# Patient Record
Sex: Male | Born: 1990 | Race: White | Hispanic: No | Marital: Single | Smoking: Current every day smoker
Health system: Southern US, Community
[De-identification: ages and names within clinical notes are randomized; demographics above are authoritative.]

## PROBLEM LIST (undated history)

## (undated) ENCOUNTER — Emergency Department (HOSPITAL_COMMUNITY): Discharge status: Against Medical Advice | Payer: Self-pay

## (undated) DIAGNOSIS — M549 Dorsalgia, unspecified: Secondary | ICD-10-CM

## (undated) DIAGNOSIS — R002 Palpitations: Secondary | ICD-10-CM

## (undated) DIAGNOSIS — R569 Unspecified convulsions: Secondary | ICD-10-CM

## (undated) DIAGNOSIS — G8929 Other chronic pain: Secondary | ICD-10-CM

## (undated) HISTORY — PX: FRACTURE SURGERY: SHX138

## (undated) HISTORY — PX: WRIST FRACTURE SURGERY: SHX121

---
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
## 2008-05-08 ENCOUNTER — Emergency Department (HOSPITAL_COMMUNITY): Admission: EM | Admit: 2008-05-08 | Discharge: 2008-05-08 | Payer: Self-pay | Admitting: Emergency Medicine

## 2008-09-30 ENCOUNTER — Emergency Department (HOSPITAL_COMMUNITY): Admission: EM | Admit: 2008-09-30 | Discharge: 2008-09-30 | Payer: Self-pay | Admitting: Emergency Medicine

## 2009-02-17 ENCOUNTER — Emergency Department (HOSPITAL_COMMUNITY): Admission: EM | Admit: 2009-02-17 | Discharge: 2009-02-17 | Payer: Self-pay | Admitting: Emergency Medicine

## 2009-05-15 ENCOUNTER — Emergency Department (HOSPITAL_COMMUNITY): Admission: EM | Admit: 2009-05-15 | Discharge: 2009-05-16 | Payer: Self-pay | Admitting: Emergency Medicine

## 2009-05-17 ENCOUNTER — Emergency Department (HOSPITAL_COMMUNITY): Admission: EM | Admit: 2009-05-17 | Discharge: 2009-05-17 | Payer: Self-pay | Admitting: Emergency Medicine

## 2009-08-07 ENCOUNTER — Emergency Department (HOSPITAL_COMMUNITY): Admission: EM | Admit: 2009-08-07 | Discharge: 2009-08-07 | Payer: Self-pay | Admitting: Family Medicine

## 2009-09-21 ENCOUNTER — Emergency Department (HOSPITAL_COMMUNITY): Admission: EM | Admit: 2009-09-21 | Discharge: 2009-09-22 | Payer: Self-pay | Admitting: Emergency Medicine

## 2009-10-23 ENCOUNTER — Emergency Department (HOSPITAL_COMMUNITY): Admission: EM | Admit: 2009-10-23 | Discharge: 2009-10-23 | Payer: Self-pay | Admitting: Family Medicine

## 2009-11-01 ENCOUNTER — Observation Stay (HOSPITAL_COMMUNITY): Admission: EM | Admit: 2009-11-01 | Discharge: 2009-11-02 | Payer: Self-pay | Admitting: Emergency Medicine

## 2009-12-14 ENCOUNTER — Emergency Department (HOSPITAL_COMMUNITY): Admission: EM | Admit: 2009-12-14 | Discharge: 2009-12-14 | Payer: Self-pay | Admitting: Emergency Medicine

## 2009-12-31 ENCOUNTER — Emergency Department (HOSPITAL_COMMUNITY): Admission: EM | Admit: 2009-12-31 | Discharge: 2009-12-31 | Payer: Self-pay | Admitting: Emergency Medicine

## 2010-01-12 ENCOUNTER — Emergency Department (HOSPITAL_COMMUNITY): Admission: EM | Admit: 2010-01-12 | Discharge: 2010-01-12 | Payer: Self-pay | Admitting: Emergency Medicine

## 2010-01-15 ENCOUNTER — Emergency Department (HOSPITAL_COMMUNITY): Admission: EM | Admit: 2010-01-15 | Discharge: 2010-01-15 | Payer: Self-pay | Admitting: Family Medicine

## 2010-02-17 ENCOUNTER — Emergency Department (HOSPITAL_COMMUNITY): Admission: EM | Admit: 2010-02-17 | Discharge: 2010-02-17 | Payer: Self-pay | Admitting: Emergency Medicine

## 2010-04-15 ENCOUNTER — Emergency Department (HOSPITAL_COMMUNITY): Admission: EM | Admit: 2010-04-15 | Discharge: 2010-04-15 | Payer: Self-pay | Admitting: Emergency Medicine

## 2010-05-03 ENCOUNTER — Emergency Department (HOSPITAL_COMMUNITY): Admission: EM | Admit: 2010-05-03 | Discharge: 2010-05-03 | Payer: Self-pay | Admitting: Emergency Medicine

## 2010-05-25 ENCOUNTER — Emergency Department (HOSPITAL_COMMUNITY): Admission: EM | Admit: 2010-05-25 | Discharge: 2010-05-25 | Payer: Self-pay | Admitting: Emergency Medicine

## 2010-06-20 ENCOUNTER — Emergency Department (HOSPITAL_COMMUNITY): Admission: EM | Admit: 2010-06-20 | Discharge: 2010-06-21 | Payer: Self-pay | Admitting: Emergency Medicine

## 2010-08-07 ENCOUNTER — Emergency Department (HOSPITAL_COMMUNITY): Admission: EM | Admit: 2010-08-07 | Discharge: 2010-08-07 | Payer: Self-pay | Admitting: Emergency Medicine

## 2010-09-14 ENCOUNTER — Emergency Department (HOSPITAL_COMMUNITY): Admission: EM | Admit: 2010-09-14 | Discharge: 2010-09-14 | Payer: Self-pay | Admitting: Emergency Medicine

## 2010-09-26 ENCOUNTER — Emergency Department (HOSPITAL_COMMUNITY): Admission: EM | Admit: 2010-09-26 | Discharge: 2010-09-26 | Payer: Self-pay | Admitting: Emergency Medicine

## 2010-10-05 ENCOUNTER — Emergency Department (HOSPITAL_COMMUNITY): Admission: EM | Admit: 2010-10-05 | Discharge: 2010-10-06 | Payer: Self-pay | Admitting: Emergency Medicine

## 2010-12-08 ENCOUNTER — Emergency Department (HOSPITAL_COMMUNITY)
Admission: EM | Admit: 2010-12-08 | Discharge: 2010-12-08 | Payer: Self-pay | Source: Home / Self Care | Admitting: Emergency Medicine

## 2011-01-02 ENCOUNTER — Emergency Department (HOSPITAL_COMMUNITY)
Admission: EM | Admit: 2011-01-02 | Discharge: 2011-01-02 | Payer: Self-pay | Source: Home / Self Care | Admitting: Emergency Medicine

## 2011-02-22 LAB — POCT I-STAT, CHEM 8
Calcium, Ion: 1.08 mmol/L — ABNORMAL LOW (ref 1.12–1.32)
Glucose, Bld: 95 mg/dL (ref 70–99)
HCT: 50 % (ref 39.0–52.0)
Hemoglobin: 17 g/dL (ref 13.0–17.0)

## 2011-02-22 LAB — DIFFERENTIAL
Basophils Relative: 1 % (ref 0–1)
Eosinophils Absolute: 0.2 10*3/uL (ref 0.0–0.7)
Eosinophils Relative: 2 % (ref 0–5)
Monocytes Absolute: 0.7 10*3/uL (ref 0.1–1.0)
Monocytes Relative: 7 % (ref 3–12)

## 2011-02-22 LAB — CBC
HCT: 47.5 % (ref 39.0–52.0)
Hemoglobin: 16.7 g/dL (ref 13.0–17.0)
MCH: 34.2 pg — ABNORMAL HIGH (ref 26.0–34.0)
MCHC: 35.2 g/dL (ref 30.0–36.0)

## 2011-02-22 LAB — URINALYSIS, ROUTINE W REFLEX MICROSCOPIC
Ketones, ur: 15 mg/dL — AB
Nitrite: NEGATIVE
Urobilinogen, UA: 1 mg/dL (ref 0.0–1.0)
pH: 6 (ref 5.0–8.0)

## 2011-02-28 LAB — URINALYSIS, ROUTINE W REFLEX MICROSCOPIC
Bilirubin Urine: NEGATIVE
Ketones, ur: NEGATIVE mg/dL
Nitrite: NEGATIVE
Protein, ur: NEGATIVE mg/dL
Specific Gravity, Urine: 1.027 (ref 1.005–1.030)
Urobilinogen, UA: 1 mg/dL (ref 0.0–1.0)

## 2011-02-28 LAB — DIFFERENTIAL
Basophils Absolute: 0 10*3/uL (ref 0.0–0.1)
Basophils Relative: 1 % (ref 0–1)
Eosinophils Absolute: 0.2 10*3/uL (ref 0.0–0.7)
Monocytes Absolute: 0.6 10*3/uL (ref 0.1–1.0)
Neutro Abs: 4.9 10*3/uL (ref 1.7–7.7)

## 2011-02-28 LAB — POCT I-STAT, CHEM 8
BUN: 11 mg/dL (ref 6–23)
Calcium, Ion: 1.16 mmol/L (ref 1.12–1.32)
Chloride: 104 mEq/L (ref 96–112)
Creatinine, Ser: 0.8 mg/dL (ref 0.4–1.5)
Glucose, Bld: 104 mg/dL — ABNORMAL HIGH (ref 70–99)
TCO2: 29 mmol/L (ref 0–100)

## 2011-02-28 LAB — CBC
Hemoglobin: 14.9 g/dL (ref 13.0–17.0)
MCHC: 35.5 g/dL (ref 30.0–36.0)
RDW: 12.8 % (ref 11.5–15.5)

## 2011-02-28 LAB — POCT CARDIAC MARKERS: Troponin i, poc: <0.05 ng/mL (ref 0.00–0.09)

## 2011-03-02 LAB — DIFFERENTIAL
Eosinophils Relative: 4 % (ref 0–5)
Lymphocytes Relative: 27 % (ref 12–46)
Lymphs Abs: 2.6 10*3/uL (ref 0.7–4.0)
Monocytes Relative: 6 % (ref 3–12)

## 2011-03-02 LAB — CBC
HCT: 44.6 % (ref 39.0–52.0)
Hemoglobin: 15.8 g/dL (ref 13.0–17.0)
MCV: 94.3 fL (ref 78.0–100.0)
Platelets: 243 10*3/uL (ref 150–400)
RBC: 4.73 MIL/uL (ref 4.22–5.81)
WBC: 9.7 10*3/uL (ref 4.0–10.5)

## 2011-03-02 LAB — POCT CARDIAC MARKERS
CKMB, poc: <1 ng/mL — ABNORMAL LOW (ref 1.0–8.0)
Myoglobin, poc: 45.8 ng/mL (ref 12–200)

## 2011-03-02 LAB — POCT I-STAT, CHEM 8
BUN: 12 mg/dL (ref 6–23)
Creatinine, Ser: 0.9 mg/dL (ref 0.4–1.5)
Glucose, Bld: 94 mg/dL (ref 70–99)
Sodium: 140 mEq/L (ref 135–145)
TCO2: 28 mmol/L (ref 0–100)

## 2011-03-03 LAB — POCT RAPID STREP A (OFFICE): Streptococcus, Group A Screen (Direct): POSITIVE — AB

## 2011-03-17 LAB — CBC
Platelets: 257 10*3/uL (ref 150–400)
RBC: 4.83 MIL/uL (ref 4.22–5.81)
WBC: 10.8 10*3/uL — ABNORMAL HIGH (ref 4.0–10.5)
WBC: 9.1 10*3/uL (ref 4.0–10.5)

## 2011-03-17 LAB — POCT URINALYSIS DIP (DEVICE)
Bilirubin Urine: NEGATIVE
Hgb urine dipstick: NEGATIVE
Ketones, ur: NEGATIVE mg/dL
pH: 7 (ref 5.0–8.0)

## 2011-03-17 LAB — POCT I-STAT, CHEM 8
BUN: 13 mg/dL (ref 6–23)
Calcium, Ion: 1.25 mmol/L (ref 1.12–1.32)
Chloride: 102 mEq/L (ref 96–112)

## 2011-03-17 LAB — DIFFERENTIAL
Basophils Relative: 1 % (ref 0–1)
Lymphocytes Relative: 20 % (ref 12–46)
Lymphs Abs: 2.2 10*3/uL (ref 0.7–4.0)
Lymphs Abs: 2.4 10*3/uL (ref 0.7–4.0)
Monocytes Relative: 9 % (ref 3–12)
Neutro Abs: 5.6 10*3/uL (ref 1.7–7.7)
Neutrophils Relative %: 73 % (ref 43–77)
Neutrophils Relative %: 62 % (ref 43–77)

## 2011-03-17 LAB — BASIC METABOLIC PANEL
BUN: 8 mg/dL (ref 6–23)
Creatinine, Ser: 0.89 mg/dL (ref 0.4–1.5)
GFR calc Af Amer: >60 mL/min (ref >60)
GFR calc non Af Amer: >60 mL/min (ref >60)
Potassium: 3.5 mEq/L (ref 3.5–5.1)

## 2011-03-17 LAB — PROTIME-INR
INR: 1.04 (ref 0.00–1.49)
Prothrombin Time: 13.5 seconds (ref 11.6–15.2)

## 2011-03-17 LAB — APTT: aPTT: 29 seconds (ref 24–37)

## 2011-03-17 LAB — ETHANOL: Alcohol, Ethyl (B): 63 mg/dL — ABNORMAL HIGH (ref 0–10)

## 2011-03-18 LAB — POCT I-STAT, CHEM 8
BUN: 16 mg/dL (ref 6–23)
Chloride: 102 mEq/L (ref 96–112)
Creatinine, Ser: 0.8 mg/dL (ref 0.4–1.5)
Glucose, Bld: 106 mg/dL — ABNORMAL HIGH (ref 70–99)
HCT: 47 % (ref 39.0–52.0)
Potassium: 4 mEq/L (ref 3.5–5.1)

## 2011-03-18 LAB — CBC
HCT: 43.7 % (ref 39.0–52.0)
Hemoglobin: 15.2 g/dL (ref 13.0–17.0)
MCV: 95.4 fL (ref 78.0–100.0)
Platelets: 277 10*3/uL (ref 150–400)
RBC: 4.58 MIL/uL (ref 4.22–5.81)
WBC: 8.7 10*3/uL (ref 4.0–10.5)

## 2011-03-18 LAB — DIFFERENTIAL
Eosinophils Absolute: 0.3 10*3/uL (ref 0.0–0.7)
Eosinophils Relative: 4 % (ref 0–5)
Lymphs Abs: 2.4 10*3/uL (ref 0.7–4.0)
Monocytes Absolute: 0.6 10*3/uL (ref 0.1–1.0)
Monocytes Relative: 6 % (ref 3–12)

## 2011-03-18 LAB — URINALYSIS, ROUTINE W REFLEX MICROSCOPIC
Nitrite: NEGATIVE
Protein, ur: NEGATIVE mg/dL
Specific Gravity, Urine: 1.005 (ref 1.005–1.030)
Urobilinogen, UA: 0.2 mg/dL (ref 0.0–1.0)

## 2011-03-18 LAB — RAPID URINE DRUG SCREEN, HOSP PERFORMED
Amphetamines: NOT DETECTED
Barbiturates: NOT DETECTED

## 2011-03-20 ENCOUNTER — Emergency Department (HOSPITAL_COMMUNITY)
Admission: EM | Admit: 2011-03-20 | Discharge: 2011-03-20 | Disposition: A | Discharge status: Home / Self Care | Payer: Medicare Other | Attending: Emergency Medicine | Admitting: Emergency Medicine

## 2011-03-20 ENCOUNTER — Emergency Department (HOSPITAL_COMMUNITY): Payer: Medicare Other

## 2011-03-20 DIAGNOSIS — M25569 Pain in unspecified knee: Secondary | ICD-10-CM | POA: Insufficient documentation

## 2011-03-20 DIAGNOSIS — IMO0002 Reserved for concepts with insufficient information to code with codable children: Secondary | ICD-10-CM | POA: Insufficient documentation

## 2011-03-20 DIAGNOSIS — K219 Gastro-esophageal reflux disease without esophagitis: Secondary | ICD-10-CM | POA: Insufficient documentation

## 2011-03-20 DIAGNOSIS — Y9361 Activity, american tackle football: Secondary | ICD-10-CM | POA: Insufficient documentation

## 2011-03-20 DIAGNOSIS — M7989 Other specified soft tissue disorders: Secondary | ICD-10-CM | POA: Insufficient documentation

## 2011-03-20 DIAGNOSIS — W219XXA Striking against or struck by unspecified sports equipment, initial encounter: Secondary | ICD-10-CM | POA: Insufficient documentation

## 2011-03-20 DIAGNOSIS — Y929 Unspecified place or not applicable: Secondary | ICD-10-CM | POA: Insufficient documentation

## 2011-04-02 ENCOUNTER — Emergency Department (HOSPITAL_COMMUNITY)
Admission: EM | Admit: 2011-04-02 | Discharge: 2011-04-02 | Disposition: A | Discharge status: Home / Self Care | Payer: Medicare Other | Attending: Emergency Medicine | Admitting: Emergency Medicine

## 2011-04-02 DIAGNOSIS — K219 Gastro-esophageal reflux disease without esophagitis: Secondary | ICD-10-CM | POA: Insufficient documentation

## 2011-04-02 DIAGNOSIS — K089 Disorder of teeth and supporting structures, unspecified: Secondary | ICD-10-CM | POA: Insufficient documentation

## 2011-08-02 ENCOUNTER — Emergency Department (HOSPITAL_COMMUNITY): Payer: Medicare Other

## 2011-08-02 ENCOUNTER — Emergency Department (HOSPITAL_COMMUNITY)
Admission: EM | Admit: 2011-08-02 | Discharge: 2011-08-02 | Disposition: A | Discharge status: Home / Self Care | Payer: Medicare Other | Attending: Emergency Medicine | Admitting: Emergency Medicine

## 2011-08-02 DIAGNOSIS — S4980XA Other specified injuries of shoulder and upper arm, unspecified arm, initial encounter: Secondary | ICD-10-CM | POA: Insufficient documentation

## 2011-08-02 DIAGNOSIS — Y92009 Unspecified place in unspecified non-institutional (private) residence as the place of occurrence of the external cause: Secondary | ICD-10-CM | POA: Insufficient documentation

## 2011-08-02 DIAGNOSIS — S46909A Unspecified injury of unspecified muscle, fascia and tendon at shoulder and upper arm level, unspecified arm, initial encounter: Secondary | ICD-10-CM | POA: Insufficient documentation

## 2011-08-02 DIAGNOSIS — K219 Gastro-esophageal reflux disease without esophagitis: Secondary | ICD-10-CM | POA: Insufficient documentation

## 2011-08-02 DIAGNOSIS — W2209XA Striking against other stationary object, initial encounter: Secondary | ICD-10-CM | POA: Insufficient documentation

## 2011-08-02 DIAGNOSIS — M79609 Pain in unspecified limb: Secondary | ICD-10-CM | POA: Insufficient documentation

## 2011-08-02 DIAGNOSIS — S40029A Contusion of unspecified upper arm, initial encounter: Secondary | ICD-10-CM | POA: Insufficient documentation

## 2011-08-13 ENCOUNTER — Emergency Department (HOSPITAL_COMMUNITY): Payer: Medicare Other

## 2011-08-13 ENCOUNTER — Emergency Department (HOSPITAL_COMMUNITY)
Admission: EM | Admit: 2011-08-13 | Discharge: 2011-08-13 | Disposition: A | Discharge status: Home / Self Care | Payer: Medicare Other | Attending: Emergency Medicine | Admitting: Emergency Medicine

## 2011-08-13 DIAGNOSIS — H5710 Ocular pain, unspecified eye: Secondary | ICD-10-CM | POA: Insufficient documentation

## 2011-08-13 DIAGNOSIS — H5789 Other specified disorders of eye and adnexa: Secondary | ICD-10-CM | POA: Insufficient documentation

## 2011-08-13 DIAGNOSIS — M542 Cervicalgia: Secondary | ICD-10-CM | POA: Insufficient documentation

## 2011-08-13 DIAGNOSIS — S0180XA Unspecified open wound of other part of head, initial encounter: Secondary | ICD-10-CM | POA: Insufficient documentation

## 2011-08-13 DIAGNOSIS — R471 Dysarthria and anarthria: Secondary | ICD-10-CM | POA: Insufficient documentation

## 2011-08-13 DIAGNOSIS — S0280XA Fracture of other specified skull and facial bones, unspecified side, initial encounter for closed fracture: Secondary | ICD-10-CM | POA: Insufficient documentation

## 2011-08-13 DIAGNOSIS — K219 Gastro-esophageal reflux disease without esophagitis: Secondary | ICD-10-CM | POA: Insufficient documentation

## 2011-08-13 DIAGNOSIS — R279 Unspecified lack of coordination: Secondary | ICD-10-CM | POA: Insufficient documentation

## 2011-08-13 DIAGNOSIS — F1010 Alcohol abuse, uncomplicated: Secondary | ICD-10-CM | POA: Insufficient documentation

## 2011-08-13 DIAGNOSIS — R51 Headache: Secondary | ICD-10-CM | POA: Insufficient documentation

## 2011-08-13 DIAGNOSIS — S0510XA Contusion of eyeball and orbital tissues, unspecified eye, initial encounter: Secondary | ICD-10-CM | POA: Insufficient documentation

## 2011-08-13 LAB — POCT I-STAT, CHEM 8
BUN: 12 mg/dL (ref 6–23)
Chloride: 99 mEq/L (ref 96–112)
Sodium: 140 mEq/L (ref 135–145)

## 2011-08-13 LAB — CBC
Hemoglobin: 15.5 g/dL (ref 13.0–17.0)
MCH: 33.7 pg (ref 26.0–34.0)
MCV: 94.6 fL (ref 78.0–100.0)
RBC: 4.6 MIL/uL (ref 4.22–5.81)

## 2011-08-13 LAB — DIFFERENTIAL
Lymphs Abs: 3.2 10*3/uL (ref 0.7–4.0)
Monocytes Relative: 5 % (ref 3–12)
Neutro Abs: 9.2 10*3/uL — ABNORMAL HIGH (ref 1.7–7.7)
Neutrophils Relative %: 68 % (ref 43–77)

## 2011-08-13 LAB — ETHANOL: Alcohol, Ethyl (B): 156 mg/dL — ABNORMAL HIGH (ref 0–11)

## 2011-08-16 ENCOUNTER — Emergency Department (HOSPITAL_COMMUNITY)
Admission: EM | Admit: 2011-08-16 | Discharge: 2011-08-16 | Disposition: A | Discharge status: Home / Self Care | Payer: Medicare Other | Attending: Emergency Medicine | Admitting: Emergency Medicine

## 2011-08-16 ENCOUNTER — Emergency Department (HOSPITAL_COMMUNITY): Payer: Medicare Other

## 2011-08-16 DIAGNOSIS — S0280XA Fracture of other specified skull and facial bones, unspecified side, initial encounter for closed fracture: Secondary | ICD-10-CM | POA: Insufficient documentation

## 2011-08-16 DIAGNOSIS — H5710 Ocular pain, unspecified eye: Secondary | ICD-10-CM | POA: Insufficient documentation

## 2011-08-16 DIAGNOSIS — H5789 Other specified disorders of eye and adnexa: Secondary | ICD-10-CM | POA: Insufficient documentation

## 2011-08-16 DIAGNOSIS — S0510XA Contusion of eyeball and orbital tissues, unspecified eye, initial encounter: Secondary | ICD-10-CM | POA: Insufficient documentation

## 2011-08-16 DIAGNOSIS — K219 Gastro-esophageal reflux disease without esophagitis: Secondary | ICD-10-CM | POA: Insufficient documentation

## 2011-08-16 DIAGNOSIS — H538 Other visual disturbances: Secondary | ICD-10-CM | POA: Insufficient documentation

## 2011-08-16 DIAGNOSIS — R51 Headache: Secondary | ICD-10-CM | POA: Insufficient documentation

## 2011-08-16 DIAGNOSIS — R42 Dizziness and giddiness: Secondary | ICD-10-CM | POA: Insufficient documentation

## 2011-09-05 ENCOUNTER — Emergency Department (HOSPITAL_COMMUNITY)
Admission: EM | Admit: 2011-09-05 | Discharge: 2011-09-05 | Disposition: A | Discharge status: Home / Self Care | Payer: Medicare Other | Attending: Emergency Medicine | Admitting: Emergency Medicine

## 2011-09-05 DIAGNOSIS — K219 Gastro-esophageal reflux disease without esophagitis: Secondary | ICD-10-CM | POA: Insufficient documentation

## 2011-09-05 DIAGNOSIS — R059 Cough, unspecified: Secondary | ICD-10-CM | POA: Insufficient documentation

## 2011-09-05 DIAGNOSIS — R05 Cough: Secondary | ICD-10-CM | POA: Insufficient documentation

## 2011-09-05 DIAGNOSIS — J3489 Other specified disorders of nose and nasal sinuses: Secondary | ICD-10-CM | POA: Insufficient documentation

## 2011-09-05 DIAGNOSIS — R112 Nausea with vomiting, unspecified: Secondary | ICD-10-CM | POA: Insufficient documentation

## 2011-09-05 DIAGNOSIS — R071 Chest pain on breathing: Secondary | ICD-10-CM | POA: Insufficient documentation

## 2011-09-05 DIAGNOSIS — R070 Pain in throat: Secondary | ICD-10-CM | POA: Insufficient documentation

## 2011-09-20 ENCOUNTER — Emergency Department (HOSPITAL_COMMUNITY)
Admission: EM | Admit: 2011-09-20 | Discharge: 2011-09-20 | Disposition: A | Discharge status: Home / Self Care | Payer: Medicare Other | Attending: Emergency Medicine | Admitting: Emergency Medicine

## 2011-09-20 DIAGNOSIS — R059 Cough, unspecified: Secondary | ICD-10-CM | POA: Insufficient documentation

## 2011-09-20 DIAGNOSIS — R1110 Vomiting, unspecified: Secondary | ICD-10-CM | POA: Insufficient documentation

## 2011-09-20 DIAGNOSIS — J40 Bronchitis, not specified as acute or chronic: Secondary | ICD-10-CM | POA: Insufficient documentation

## 2011-09-20 DIAGNOSIS — J069 Acute upper respiratory infection, unspecified: Secondary | ICD-10-CM | POA: Insufficient documentation

## 2011-09-20 DIAGNOSIS — R0602 Shortness of breath: Secondary | ICD-10-CM | POA: Insufficient documentation

## 2011-09-20 DIAGNOSIS — K219 Gastro-esophageal reflux disease without esophagitis: Secondary | ICD-10-CM | POA: Insufficient documentation

## 2011-09-20 DIAGNOSIS — R05 Cough: Secondary | ICD-10-CM | POA: Insufficient documentation

## 2011-09-20 DIAGNOSIS — R509 Fever, unspecified: Secondary | ICD-10-CM | POA: Insufficient documentation

## 2011-09-20 DIAGNOSIS — J029 Acute pharyngitis, unspecified: Secondary | ICD-10-CM | POA: Insufficient documentation

## 2011-09-20 LAB — RAPID STREP SCREEN (MED CTR MEBANE ONLY): Streptococcus, Group A Screen (Direct): NEGATIVE

## 2011-09-25 ENCOUNTER — Emergency Department (HOSPITAL_COMMUNITY)
Admission: EM | Admit: 2011-09-25 | Discharge: 2011-09-25 | Disposition: A | Discharge status: Home / Self Care | Payer: Medicare Other | Attending: Emergency Medicine | Admitting: Emergency Medicine

## 2011-09-25 DIAGNOSIS — R1310 Dysphagia, unspecified: Secondary | ICD-10-CM | POA: Insufficient documentation

## 2011-09-25 DIAGNOSIS — K219 Gastro-esophageal reflux disease without esophagitis: Secondary | ICD-10-CM | POA: Insufficient documentation

## 2011-09-25 DIAGNOSIS — R070 Pain in throat: Secondary | ICD-10-CM | POA: Insufficient documentation

## 2011-09-25 LAB — RAPID STREP SCREEN (MED CTR MEBANE ONLY): Streptococcus, Group A Screen (Direct): NEGATIVE

## 2011-10-10 ENCOUNTER — Emergency Department (HOSPITAL_COMMUNITY): Payer: Medicare Other

## 2011-10-10 ENCOUNTER — Emergency Department (HOSPITAL_COMMUNITY)
Admission: EM | Admit: 2011-10-10 | Discharge: 2011-10-10 | Disposition: A | Discharge status: Home / Self Care | Payer: Medicare Other | Attending: Emergency Medicine | Admitting: Emergency Medicine

## 2011-10-10 DIAGNOSIS — S6730XA Crushing injury of unspecified wrist, initial encounter: Secondary | ICD-10-CM | POA: Insufficient documentation

## 2011-10-10 DIAGNOSIS — K219 Gastro-esophageal reflux disease without esophagitis: Secondary | ICD-10-CM | POA: Insufficient documentation

## 2011-10-10 DIAGNOSIS — W230XXA Caught, crushed, jammed, or pinched between moving objects, initial encounter: Secondary | ICD-10-CM | POA: Insufficient documentation

## 2011-10-10 DIAGNOSIS — M79609 Pain in unspecified limb: Secondary | ICD-10-CM | POA: Insufficient documentation

## 2011-10-10 DIAGNOSIS — M25539 Pain in unspecified wrist: Secondary | ICD-10-CM | POA: Insufficient documentation

## 2011-10-15 ENCOUNTER — Emergency Department (HOSPITAL_COMMUNITY)
Admission: EM | Admit: 2011-10-15 | Discharge: 2011-10-15 | Disposition: A | Discharge status: Home / Self Care | Payer: Medicare Other | Attending: Emergency Medicine | Admitting: Emergency Medicine

## 2011-10-15 DIAGNOSIS — K219 Gastro-esophageal reflux disease without esophagitis: Secondary | ICD-10-CM | POA: Insufficient documentation

## 2011-10-15 DIAGNOSIS — M25539 Pain in unspecified wrist: Secondary | ICD-10-CM | POA: Insufficient documentation

## 2011-10-31 ENCOUNTER — Emergency Department (HOSPITAL_COMMUNITY): Payer: Medicare Other

## 2011-10-31 ENCOUNTER — Emergency Department (HOSPITAL_COMMUNITY)
Admission: EM | Admit: 2011-10-31 | Discharge: 2011-10-31 | Disposition: A | Discharge status: Home / Self Care | Payer: Medicare Other | Attending: Emergency Medicine | Admitting: Emergency Medicine

## 2011-10-31 DIAGNOSIS — R51 Headache: Secondary | ICD-10-CM | POA: Insufficient documentation

## 2011-10-31 DIAGNOSIS — M25569 Pain in unspecified knee: Secondary | ICD-10-CM | POA: Insufficient documentation

## 2011-10-31 DIAGNOSIS — M79609 Pain in unspecified limb: Secondary | ICD-10-CM | POA: Insufficient documentation

## 2011-10-31 DIAGNOSIS — M25561 Pain in right knee: Secondary | ICD-10-CM

## 2011-10-31 DIAGNOSIS — M79601 Pain in right arm: Secondary | ICD-10-CM

## 2011-10-31 DIAGNOSIS — IMO0002 Reserved for concepts with insufficient information to code with codable children: Secondary | ICD-10-CM | POA: Insufficient documentation

## 2011-10-31 MED ORDER — OXYCODONE-ACETAMINOPHEN 5-325 MG PO TABS
2.0000 | ORAL_TABLET | Freq: Once | ORAL | Status: AC
  Administered 2011-10-31: 2 via ORAL
  Filled 2011-10-31 (×2): qty 1

## 2011-10-31 MED ORDER — HYDROCODONE-ACETAMINOPHEN 5-325 MG PO TABS: 2.0000 | ORAL_TABLET | ORAL | Status: AC | PRN

## 2011-10-31 NOTE — ED Provider Notes (Signed)
Medical screening examination/treatment/procedure(s) were performed by non-physician practitioner and as supervising physician I was immediately available for consultation/collaboration.  Zaeem Kandel L Kady Toothaker, MD 10/31/11 2116 
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
## 2011-11-19 ENCOUNTER — Encounter (HOSPITAL_COMMUNITY): Payer: Self-pay | Admitting: *Deleted

## 2011-11-19 ENCOUNTER — Emergency Department (HOSPITAL_COMMUNITY): Payer: Medicare Other

## 2011-11-19 ENCOUNTER — Emergency Department (HOSPITAL_COMMUNITY)
Admission: EM | Admit: 2011-11-19 | Discharge: 2011-11-19 | Disposition: A | Discharge status: Home / Self Care | Payer: Medicare Other | Attending: Emergency Medicine | Admitting: Emergency Medicine

## 2011-11-19 DIAGNOSIS — M546 Pain in thoracic spine: Secondary | ICD-10-CM | POA: Insufficient documentation

## 2011-11-19 DIAGNOSIS — M549 Dorsalgia, unspecified: Secondary | ICD-10-CM

## 2011-11-19 MED ORDER — OXYCODONE-ACETAMINOPHEN 5-325 MG PO TABS: 2.0000 | ORAL_TABLET | ORAL | Status: AC | PRN

## 2011-11-19 MED ORDER — CYCLOBENZAPRINE HCL 10 MG PO TABS: 10.0000 mg | ORAL_TABLET | Freq: Two times a day (BID) | ORAL | Status: AC | PRN

## 2011-11-19 MED ORDER — OXYCODONE-ACETAMINOPHEN 5-325 MG PO TABS
1.0000 | ORAL_TABLET | Freq: Once | ORAL | Status: AC
  Administered 2011-11-19: 1 via ORAL
  Filled 2011-11-19: qty 1

## 2011-11-19 MED ORDER — DIAZEPAM 5 MG PO TABS
5.0000 mg | ORAL_TABLET | Freq: Once | ORAL | Status: AC
Start: 2011-11-19 — End: 2011-11-19
  Administered 2011-11-19: 5 mg via ORAL
  Filled 2011-11-19: qty 1

## 2011-11-19 NOTE — ED Notes (Signed)
Patient was playing football last night.  He was tackled.  He states he is now having mid back pain.  Patient reports he has diff walking due to increased pain

## 2011-11-19 NOTE — ED Notes (Signed)
Playing football with brother and threw back out after being tackled by brother. This occurred last night. Rates pain as 10/10. Sharp stabbing pain with movement.

## 2011-11-19 NOTE — ED Provider Notes (Signed)
History     CSN: [id number] Arrival date & time: 11/19/2011 11:09 AM   First MD Initiated Contact with Patient 11/19/11 1119      Chief Complaint  Patient presents with  . Back Pain    (Consider location/radiation/quality/duration/timing/severity/associated sxs/prior treatment) Patient is a 20 y.o. male presenting with back pain. The history is provided by the patient.  Back Pain  This is a new problem. The current episode started yesterday. The problem occurs constantly. The problem has been gradually worsening. The pain is present in the thoracic spine. The quality of the pain is described as stabbing. The pain does not radiate. The pain is severe. The symptoms are aggravated by bending, twisting and certain positions. The pain is the same all the time. Pertinent negatives include no bowel incontinence and no bladder incontinence. He has tried NSAIDs for the symptoms. The treatment provided no relief.   Patient states he was playing football with brother yesterday, received hard tackle.  Discomfort developed later during day. History reviewed. No pertinent past medical history.  Past Surgical History  Procedure Date  . Wrist fracture surgery     No family history on file.  History  Substance Use Topics  . Smoking status: Current Some Day Smoker  . Smokeless tobacco: Not on file  . Alcohol Use: No      Review of Systems  Gastrointestinal: Negative for bowel incontinence.  Genitourinary: Negative for bladder incontinence.  Musculoskeletal: Positive for back pain.  All other systems reviewed and are negative.    Allergies  Review of patient's allergies indicates no known allergies.  Home Medications   Current Outpatient Rx  Name Route Sig Dispense Refill  . IBUPROFEN 200 MG PO TABS Oral Take 800 mg by mouth every 6 (six) hours as needed. Migraine, pain.       BP 122/67  Pulse 89  Temp(Src) 98.1 F (36.7 C) (Oral)  Resp 20  SpO2 98%  Physical Exam  Nursing  note and vitals reviewed. Constitutional: He is oriented to person, place, and time. He appears well-developed and well-nourished.  HENT:  Head: Normocephalic.  Eyes: Pupils are equal, round, and reactive to light.  Neck: Normal range of motion. Neck supple.  Cardiovascular: Normal rate, regular rhythm and normal heart sounds.   Pulmonary/Chest: Effort normal and breath sounds normal.  Abdominal: Soft. Bowel sounds are normal.  Musculoskeletal: Normal range of motion.  Neurological: He is alert and oriented to person, place, and time.  Skin: Skin is warm and dry.  Psychiatric: He has a normal mood and affect.   Increased discomfort with SLR.  ED Course  Procedures (including critical care time)  Labs Reviewed - No data to display No results found.   No diagnosis found.   Radiology results reviewed and discussed with patient.  No bony  Abnormality noted, disk spaces normal. MDM          Jimmye Norman, NP 11/20/11 0018  Jimmye Norman, NP 11/20/11 0022

## 2011-11-19 NOTE — ED Notes (Signed)
Pt asking for meds for his "girlfriend" that is sitting at bedside. States she has a toothache. Explained to pt that we are not allowed to dispense meds to nonpatients and that if the gf feels she has an emergency and wants to be seen she can check in.

## 2011-11-21 NOTE — ED Provider Notes (Signed)
History/physical exam/procedure(s) were performed by non-physician practitioner and as supervising physician I was immediately available for consultation/collaboration. I have reviewed all notes and am in agreement with care and plan.   Hilario Quarry, MD 11/21/11 1057

## 2011-12-31 ENCOUNTER — Emergency Department (HOSPITAL_COMMUNITY)
Admission: EM | Admit: 2011-12-31 | Discharge: 2011-12-31 | Disposition: A | Discharge status: Home / Self Care | Payer: Medicare Other | Attending: Emergency Medicine | Admitting: Emergency Medicine

## 2011-12-31 ENCOUNTER — Encounter (HOSPITAL_COMMUNITY): Payer: Self-pay

## 2011-12-31 DIAGNOSIS — M549 Dorsalgia, unspecified: Secondary | ICD-10-CM | POA: Insufficient documentation

## 2011-12-31 DIAGNOSIS — M545 Low back pain: Secondary | ICD-10-CM | POA: Diagnosis not present

## 2011-12-31 MED ORDER — CYCLOBENZAPRINE HCL 10 MG PO TABS: 10.0000 mg | ORAL_TABLET | Freq: Two times a day (BID) | ORAL | Status: DC | PRN

## 2011-12-31 MED ORDER — CYCLOBENZAPRINE HCL 10 MG PO TABS
10.0000 mg | ORAL_TABLET | Freq: Once | ORAL | Status: AC
  Administered 2011-12-31: 10 mg via ORAL
  Filled 2011-12-31: qty 1

## 2011-12-31 MED ORDER — IBUPROFEN 800 MG PO TABS: 800.0000 mg | ORAL_TABLET | Freq: Three times a day (TID) | ORAL | Status: DC | PRN

## 2011-12-31 MED ORDER — OXYCODONE-ACETAMINOPHEN 5-325 MG PO TABS
1.0000 | ORAL_TABLET | Freq: Once | ORAL | Status: AC
  Administered 2011-12-31: 1 via ORAL
  Filled 2011-12-31: qty 1

## 2011-12-31 MED ORDER — HYDROCODONE-ACETAMINOPHEN 5-325 MG PO TABS: ORAL_TABLET | ORAL | Status: DC

## 2011-12-31 NOTE — ED Provider Notes (Signed)
History     CSN: [id number]  Arrival date & time 12/31/11  1814   First MD Initiated Contact with Patient 12/31/11 1831      Chief Complaint  Patient presents with  . Back Pain    was tackled playing football today.    (Consider location/radiation/quality/duration/timing/severity/associated sxs/prior treatment) HPI Comments: Patient with history of back pain -- presents with worsening mid back pain that began after playing football in the snow with his brother today. Patient had similar symptoms one month ago and was treated with pain medicine and muscle relaxer. He states he felt better after 5 days of this. Patient denies bowel or bladder incontinence. He denies other red flag signs and symptoms of lower back pain. No treatments prior to arrival. Patient denies radiation of pain into his legs.  Patient is a 21 y.o. male presenting with back pain. The history is provided by the patient and a relative.  Back Pain  This is a recurrent problem. The current episode started 12 to 24 hours ago. The problem occurs constantly. The pain is associated with twisting. The pain is present in the lumbar spine and thoracic spine. The pain is moderate. The symptoms are aggravated by bending and twisting. Pertinent negatives include no fever, no numbness, no abdominal pain, no bowel incontinence, no perianal numbness, no bladder incontinence, no dysuria, no paresthesias, no paresis and no weakness. He has tried nothing for the symptoms.    History reviewed. No pertinent past medical history.  Past Surgical History  Procedure Date  . Wrist fracture surgery     History reviewed. No pertinent family history.  History  Substance Use Topics  . Smoking status: Current Some Day Smoker  . Smokeless tobacco: Not on file  . Alcohol Use: No      Review of Systems  Constitutional: Negative for fever and unexpected weight change.  Gastrointestinal: Negative for nausea, vomiting, abdominal pain,  constipation and bowel incontinence.       Neg for fecal incontinence  Genitourinary: Negative for bladder incontinence, dysuria, flank pain and difficulty urinating.  Musculoskeletal: Positive for back pain.  Skin: Negative for color change and wound.  Neurological: Negative for weakness, numbness and paresthesias.       Neg for numbness, tingling, or weakness in extremities. Neg for saddle paresthesias.     Allergies  Review of patient's allergies indicates no known allergies.  Home Medications   Current Outpatient Rx  Name Route Sig Dispense Refill  . CYCLOBENZAPRINE HCL 10 MG PO TABS Oral Take 1 tablet (10 mg total) by mouth 2 (two) times daily as needed for muscle spasms. 14 tablet 0  . HYDROCODONE-ACETAMINOPHEN 5-325 MG PO TABS  Take 1-2 tablets every 6 hours as needed for severe pain 10 tablet 0  . IBUPROFEN 200 MG PO TABS Oral Take 800 mg by mouth every 6 (six) hours as needed. Migraine, pain.     . IBUPROFEN 800 MG PO TABS Oral Take 1 tablet (800 mg total) by mouth every 8 (eight) hours as needed for pain. 15 tablet 0    BP 131/76  Pulse 98  Temp(Src) 98.3 F (36.8 C) (Oral)  Resp 16  SpO2 99%  Physical Exam  Nursing note and vitals reviewed. Constitutional: He is oriented to person, place, and time. He appears well-developed and well-nourished.  HENT:  Head: Normocephalic and atraumatic.  Eyes: Conjunctivae are normal.  Neck: Normal range of motion.  Abdominal: Soft. There is no tenderness.  Musculoskeletal: Normal range of  motion. He exhibits no tenderness.       No step-off with palpation of spine. There is no cervical/thoracic/lumbar spinal tenderness. There is thoracic/lumbar paraspinal tenderness.   Neurological: He is alert and oriented to person, place, and time. He has normal reflexes. He exhibits normal muscle tone.       5/5 strength in entire lower extremities bilaterally. No sensation deficit.   Skin: Skin is warm and dry.  Psychiatric: He has a normal  mood and affect.    ED Course  Procedures (including critical care time)  Labs Reviewed - No data to display No results found.   1. Back pain    6:57 PM patient seen and examined. Pain medicine and muscle relaxer ordered. Patient was counseled on back pain precautions and told to do activity as tolerated but do not lift, push, or pull heavy objects more than 10 pounds.  Patient counseled to use ice or heat on back for no longer than 15 minutes every hour.  Questions answered.  Patient verbalized understanding.  Urged patient to continue looking for a primary care physician and to followup in the future. Resources given.  6:58 PM Patient counseled on use of narcotic pain medications. Counseled not to combine these medications with others containing tylenol. Urged not to drink alcohol, drive, or perform any other activities that requires focus while taking these medications. The patient verbalizes understanding and agrees with the plan.  6:58 PM Patient counseled on proper use of muscle relaxant medication.  They were told not to drink alcohol, drive any vehicle, or do any dangerous activities while taking this medication.  Patient verbalized understanding.       MDM  Patient with back pain.  No neurological deficits and normal neuro exam.  Patient can walk but states is painful.  No loss of bowel or bladder control.  No concern for cauda equina.  No fever, night sweats, weight loss, h/o cancer, IVDU.  RICE protocol and pain medicine indicated.           Carolee Rota, [place] 12/31/11 1858

## 2012-01-02 ENCOUNTER — Emergency Department (HOSPITAL_COMMUNITY)
Admission: EM | Admit: 2012-01-02 | Discharge: 2012-01-02 | Disposition: A | Discharge status: Home / Self Care | Payer: Medicare Other | Attending: Emergency Medicine | Admitting: Emergency Medicine

## 2012-01-02 ENCOUNTER — Encounter (HOSPITAL_COMMUNITY): Payer: Self-pay | Admitting: Emergency Medicine

## 2012-01-02 DIAGNOSIS — F17200 Nicotine dependence, unspecified, uncomplicated: Secondary | ICD-10-CM | POA: Insufficient documentation

## 2012-01-02 DIAGNOSIS — L298 Other pruritus: Secondary | ICD-10-CM | POA: Diagnosis not present

## 2012-01-02 DIAGNOSIS — L2989 Other pruritus: Secondary | ICD-10-CM | POA: Insufficient documentation

## 2012-01-02 DIAGNOSIS — B86 Scabies: Secondary | ICD-10-CM | POA: Insufficient documentation

## 2012-01-02 DIAGNOSIS — M545 Low back pain: Secondary | ICD-10-CM | POA: Diagnosis not present

## 2012-01-02 DIAGNOSIS — M549 Dorsalgia, unspecified: Secondary | ICD-10-CM

## 2012-01-02 HISTORY — DX: Other chronic pain: G89.29

## 2012-01-02 HISTORY — DX: Dorsalgia, unspecified: M54.9

## 2012-01-02 MED ORDER — OXYCODONE-ACETAMINOPHEN 5-325 MG PO TABS
2.0000 | ORAL_TABLET | Freq: Once | ORAL | Status: AC
  Administered 2012-01-02: 2 via ORAL
  Filled 2012-01-02: qty 2

## 2012-01-02 MED ORDER — OXYCODONE-ACETAMINOPHEN 5-325 MG PO TABS: 1.0000 | ORAL_TABLET | Freq: Four times a day (QID) | ORAL | Status: DC | PRN

## 2012-01-02 MED ORDER — PERMETHRIN 5 % EX CREA: TOPICAL_CREAM | Freq: Once | CUTANEOUS | Status: AC

## 2012-01-02 NOTE — ED Notes (Signed)
Was seen on fri and it gave him a rash from norco and pt states that he needs more pain meds, not helping chronic back pain

## 2012-01-02 NOTE — ED Provider Notes (Signed)
Medical screening examination/treatment/procedure(s) were performed by non-physician practitioner and as supervising physician I was immediately available for consultation/collaboration.   Dayton Bailiff, MD 01/02/12 2244

## 2012-01-02 NOTE — ED Provider Notes (Signed)
History     CSN: [id number]  Arrival date & time 01/02/12  1535   First MD Initiated Contact with Patient 01/02/12 1622      Chief Complaint  Patient presents with  . Back Pain    (Consider location/radiation/quality/duration/timing/severity/associated sxs/prior treatment) HPI  Back Pain: Patient presents for presents evaluation of low back problems.  Symptoms have been present for 3 days and include pain in lower back (sharp in character; 10/10 in severity). Initial inciting event: playing in the snow. Symptoms are worst: when he moves. Alleviating factors identifiable by patient are sitting. Exacerbating factors identifiable by patient are movement. Treatments so far initiated by patient: flexiril and norco Previous lower back problems: injury from football 2 months ago. Previous workup: none. Previous treatments: muscle relaxers and norco.    Past Medical History  Diagnosis Date  . Chronic back pain greater than 3 months duration     Past Surgical History  Procedure Date  . Wrist fracture surgery     No family history on file.  History  Substance Use Topics  . Smoking status: Current Some Day Smoker  . Smokeless tobacco: Not on file  . Alcohol Use: No      Review of Systems  All other systems reviewed and are negative.    Allergies  Review of patient's allergies indicates no known allergies.  Home Medications   Current Outpatient Rx  Name Route Sig Dispense Refill  . CYCLOBENZAPRINE HCL 10 MG PO TABS Oral Take 1 tablet (10 mg total) by mouth 2 (two) times daily as needed for muscle spasms. 14 tablet 0  . HYDROCODONE-ACETAMINOPHEN 5-325 MG PO TABS  Take 1-2 tablets every 6 hours as needed for severe pain 10 tablet 0  . IBUPROFEN 200 MG PO TABS Oral Take 800 mg by mouth every 6 (six) hours as needed. pain    . IBUPROFEN 800 MG PO TABS Oral Take 1 tablet (800 mg total) by mouth every 8 (eight) hours as needed for pain. 15 tablet 0  . OXYCODONE-ACETAMINOPHEN  5-325 MG PO TABS Oral Take 1 tablet by mouth every 6 (six) hours as needed for pain. 15 tablet 0    BP 122/67  Pulse 85  Temp(Src) 98.3 F (36.8 C) (Oral)  Resp 20  SpO2 96%  Physical Exam  Nursing note and vitals reviewed. Constitutional: He is oriented to person, place, and time. He appears well-developed and well-nourished.  HENT:  Head: Normocephalic and atraumatic.  Mouth/Throat: Dental caries present.  Eyes: Conjunctivae and EOM are normal. Pupils are equal, round, and reactive to light.  Neck: Normal range of motion. Neck supple.  Cardiovascular: Normal rate and regular rhythm.   Pulmonary/Chest: Effort normal and breath sounds normal.  Musculoskeletal:       Lumbar back: He exhibits decreased range of motion, tenderness, pain and spasm. He exhibits no bony tenderness, no swelling, no edema, no deformity, no laceration and normal pulse.  Neurological: He is alert and oriented to person, place, and time.  Skin: Skin is warm and dry.    ED Course  Procedures (including critical care time)  Labs Reviewed - No data to display No results found.   1. Back pain       MDM  Pts symptoms have not changes since last visit. Pt wanted to get an MRI done. He states that he is illiterate and his gf has called the orthopedist and is getting an appointment set up for further back management. Pt says that many  family members have back problems. Pts has Norco with him and he has not taken more than prescribed. I will change his pain medications and informed him of smpyomts that warrant return back to ED.    I still have some concern that the patient may have some seeking behavior. I have advised him that he can not continue to get different pain medications in the ED and that I am putting a note in my chart to make all the providers aware that this is hs 3rd visit for pain medications in two months. He needs to see the Orthopedist for refills.  Dorthula Matas, PA 01/02/12  1714  Dorthula Matas, PA 01/02/12 1718

## 2012-01-03 NOTE — ED Provider Notes (Signed)
History     CSN: [id number]  Arrival date & time 01/02/12  2256   First MD Initiated Contact with Patient 01/02/12 2324      No chief complaint on file.   (Consider location/radiation/quality/duration/timing/severity/associated sxs/prior treatment) HPI Comments: Patient here with itchy fine rash to abdomen, back and groin area - states has had scabies in the past and this feels just like them - states two other family members with him have the same rash.  Patient is a 21 y.o. male presenting with rash. The history is provided by the patient. No language interpreter was used.  Rash  This is a new problem. The current episode started more than 2 days ago. The problem has not changed since onset.Associated with: two other family members with same rash. There has been no fever. The rash is present on the torso, back and groin. The pain is at a severity of 0/10. The patient is experiencing no pain. The pain has been constant since onset. Associated symptoms include itching. He has tried nothing for the symptoms. The treatment provided no relief.    Past Medical History  Diagnosis Date  . Chronic back pain greater than 3 months duration     Past Surgical History  Procedure Date  . Wrist fracture surgery     No family history on file.  History  Substance Use Topics  . Smoking status: Current Some Day Smoker  . Smokeless tobacco: Not on file  . Alcohol Use: No      Review of Systems  Skin: Positive for itching and rash.  All other systems reviewed and are negative.    Allergies  Review of patient's allergies indicates no known allergies.  Home Medications   Current Outpatient Rx  Name Route Sig Dispense Refill  . CYCLOBENZAPRINE HCL 10 MG PO TABS Oral Take 10 mg by mouth 2 (two) times daily as needed. For muscle pain    . HYDROCODONE-ACETAMINOPHEN 5-325 MG PO TABS Oral Take 1 tablet by mouth every 6 (six) hours as needed. severe pain    . OXYCODONE-ACETAMINOPHEN 5-325  MG PO TABS Oral Take 1 tablet by mouth every 6 (six) hours as needed. For pain    . PERMETHRIN 5 % EX CREA Topical Apply topically once. May re-apply in 7 days if needed 60 g 0    BP 118/71  Pulse 86  Temp(Src) 98.2 F (36.8 C) (Oral)  SpO2 95%  Physical Exam  Nursing note and vitals reviewed. Constitutional: He is oriented to person, place, and time. He appears well-developed and well-nourished. No distress.  HENT:  Head: Normocephalic and atraumatic.  Nose: Nose normal.  Mouth/Throat: Oropharynx is clear and moist. No oropharyngeal exudate.  Eyes: Conjunctivae are normal. Pupils are equal, round, and reactive to light. No scleral icterus.  Neck: Normal range of motion. Neck supple.  Cardiovascular: Normal rate, regular rhythm and normal heart sounds.  Exam reveals no gallop and no friction rub.   No murmur heard. Pulmonary/Chest: Effort normal and breath sounds normal. No respiratory distress.  Abdominal: Soft. Bowel sounds are normal. There is no tenderness.  Musculoskeletal: Normal range of motion.  Lymphadenopathy:    He has no cervical adenopathy.  Neurological: He is alert and oriented to person, place, and time. No cranial nerve deficit.  Skin: Skin is warm and dry. Rash noted.       Diffuse papular itchy rash to abdomen, back and groin  Psychiatric: He has a normal mood and affect. His behavior is  normal. Judgment and thought content normal.    ED Course  Procedures (including critical care time)  Labs Reviewed - No data to display No results found.   1. Scabies       MDM  Patient here with two other family members with scabies type rash and intense itching.        Izola Price [place], [place] 01/03/12 0131

## 2012-01-03 NOTE — ED Provider Notes (Signed)
Medical screening examination/treatment/procedure(s) were performed by non-physician practitioner and as supervising physician I was immediately available for consultation/collaboration.  Ethelda Chick, MD 01/03/12 [phone]

## 2012-01-12 NOTE — ED Provider Notes (Signed)
Evaluation and management procedures were performed by the PA/NP under my supervision/collaboration.    Judge D Genessis Flanary, MD 01/12/12 1859 

## 2012-04-06 DIAGNOSIS — L255 Unspecified contact dermatitis due to plants, except food: Secondary | ICD-10-CM | POA: Diagnosis not present

## 2012-04-06 DIAGNOSIS — M545 Low back pain: Secondary | ICD-10-CM | POA: Diagnosis not present

## 2012-04-06 DIAGNOSIS — G8929 Other chronic pain: Secondary | ICD-10-CM | POA: Diagnosis not present

## 2012-04-06 DIAGNOSIS — S335XXA Sprain of ligaments of lumbar spine, initial encounter: Secondary | ICD-10-CM | POA: Diagnosis not present

## 2012-04-06 DIAGNOSIS — S21209A Unspecified open wound of unspecified back wall of thorax without penetration into thoracic cavity, initial encounter: Secondary | ICD-10-CM | POA: Diagnosis not present

## 2012-04-12 DIAGNOSIS — L299 Pruritus, unspecified: Secondary | ICD-10-CM | POA: Diagnosis not present

## 2012-04-12 DIAGNOSIS — R21 Rash and other nonspecific skin eruption: Secondary | ICD-10-CM | POA: Diagnosis not present

## 2012-07-15 ENCOUNTER — Emergency Department (HOSPITAL_COMMUNITY)
Admission: EM | Admit: 2012-07-15 | Discharge: 2012-07-15 | Disposition: A | Discharge status: Home / Self Care | Payer: Medicare Other | Attending: Emergency Medicine | Admitting: Emergency Medicine

## 2012-07-15 ENCOUNTER — Emergency Department (HOSPITAL_COMMUNITY): Payer: Medicare Other

## 2012-07-15 ENCOUNTER — Encounter (HOSPITAL_COMMUNITY): Payer: Self-pay | Admitting: *Deleted

## 2012-07-15 DIAGNOSIS — R51 Headache: Secondary | ICD-10-CM | POA: Insufficient documentation

## 2012-07-15 DIAGNOSIS — F17200 Nicotine dependence, unspecified, uncomplicated: Secondary | ICD-10-CM | POA: Diagnosis not present

## 2012-07-15 DIAGNOSIS — H5710 Ocular pain, unspecified eye: Secondary | ICD-10-CM | POA: Insufficient documentation

## 2012-07-15 DIAGNOSIS — R9389 Abnormal findings on diagnostic imaging of other specified body structures: Secondary | ICD-10-CM | POA: Diagnosis not present

## 2012-07-15 DIAGNOSIS — G8929 Other chronic pain: Secondary | ICD-10-CM | POA: Insufficient documentation

## 2012-07-15 DIAGNOSIS — R930 Abnormal findings on diagnostic imaging of skull and head, not elsewhere classified: Secondary | ICD-10-CM | POA: Diagnosis not present

## 2012-07-15 MED ORDER — HYDROCODONE-ACETAMINOPHEN 5-325 MG PO TABS: 2.0000 | ORAL_TABLET | Freq: Three times a day (TID) | ORAL | Status: AC | PRN

## 2012-07-15 NOTE — ED Notes (Signed)
Patient transported to CT 
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
## 2012-07-15 NOTE — ED Notes (Signed)
Returned from CT.

## 2012-09-24 ENCOUNTER — Emergency Department (HOSPITAL_COMMUNITY)
Admission: EM | Admit: 2012-09-24 | Discharge: 2012-09-24 | Disposition: A | Discharge status: Home / Self Care | Payer: Medicare Other | Attending: Emergency Medicine | Admitting: Emergency Medicine

## 2012-09-24 ENCOUNTER — Encounter (HOSPITAL_COMMUNITY): Payer: Self-pay | Admitting: Emergency Medicine

## 2012-09-24 DIAGNOSIS — F17200 Nicotine dependence, unspecified, uncomplicated: Secondary | ICD-10-CM | POA: Insufficient documentation

## 2012-09-24 DIAGNOSIS — G8929 Other chronic pain: Secondary | ICD-10-CM | POA: Insufficient documentation

## 2012-09-24 DIAGNOSIS — M549 Dorsalgia, unspecified: Secondary | ICD-10-CM

## 2012-09-24 DIAGNOSIS — M545 Low back pain, unspecified: Secondary | ICD-10-CM | POA: Diagnosis not present

## 2012-09-24 MED ORDER — HYDROMORPHONE HCL PF 2 MG/ML IJ SOLN
2.0000 mg | Freq: Once | INTRAMUSCULAR | Status: AC
  Administered 2012-09-24: 2 mg via INTRAMUSCULAR
  Filled 2012-09-24: qty 1

## 2012-09-24 MED ORDER — NAPROXEN 500 MG PO TABS: 500.0000 mg | ORAL_TABLET | Freq: Two times a day (BID) | ORAL | Status: DC | PRN

## 2012-09-24 MED ORDER — OXYCODONE-ACETAMINOPHEN 5-325 MG PO TABS: 1.0000 | ORAL_TABLET | ORAL | Status: DC | PRN

## 2012-09-24 MED ORDER — CYCLOBENZAPRINE HCL 10 MG PO TABS: 10.0000 mg | ORAL_TABLET | Freq: Three times a day (TID) | ORAL | Status: DC | PRN

## 2012-09-24 MED ORDER — KETOROLAC TROMETHAMINE 60 MG/2ML IM SOLN
60.0000 mg | Freq: Once | INTRAMUSCULAR | Status: AC
  Administered 2012-09-24: 60 mg via INTRAMUSCULAR
  Filled 2012-09-24: qty 2

## 2012-09-24 NOTE — ED Notes (Signed)
Pt c/o severe lower back pain. Pt states that he has is back go out on him every once in a while and it went out yesterday evening. Pt is unable to move without severe pain. Pt stated "feels like a butcher knife stabbing me in my back". Pt also stated the pain takes his breath away. Denies trouble voiding or having BMs. Denies numbness and tingling.

## 2012-09-24 NOTE — ED Provider Notes (Signed)
History     CSN: [id number]  Arrival date & time 09/24/12  0920   First MD Initiated Contact with Patient 09/24/12 [phone]      Chief Complaint  Patient presents with  . Back Pain    (Consider location/radiation/quality/duration/timing/severity/associated sxs/prior treatment) HPI Pt with history of chronic back pain off and on for several months reports severe aching and sharp R lower back pain since yesterday evening. No injury or falls. No fever, leg numbness, bowel or bladder problems. Has been seen in the ED for same in the past. Pain is worse with movement and certain positions.  Past Medical History  Diagnosis Date  . Chronic back pain greater than 3 months duration     Past Surgical History  Procedure Date  . Wrist fracture surgery     No family history on file.  History  Substance Use Topics  . Smoking status: Current Some Day Smoker  . Smokeless tobacco: Not on file  . Alcohol Use: No      Review of Systems All other systems reviewed and are negative except as noted in HPI.   Allergies  Review of patient's allergies indicates no known allergies.  Home Medications  No current outpatient prescriptions on file.  BP 127/73  Pulse 87  Temp 97.6 F (36.4 C) (Oral)  Resp 18  SpO2 97%  Physical Exam  Nursing note and vitals reviewed. Constitutional: He is oriented to person, place, and time. He appears well-developed and well-nourished.  HENT:  Head: Normocephalic and atraumatic.  Eyes: EOM are normal. Pupils are equal, round, and reactive to light.  Neck: Normal range of motion. Neck supple.  Cardiovascular: Normal rate, normal heart sounds and intact distal pulses.   Pulmonary/Chest: Effort normal and breath sounds normal.  Abdominal: Bowel sounds are normal. He exhibits no distension. There is no tenderness.  Musculoskeletal: Normal range of motion. He exhibits tenderness. He exhibits no edema.       Lumbar back: He exhibits tenderness and spasm. He  exhibits no bony tenderness.       Back:  Neurological: He is alert and oriented to person, place, and time. He has normal strength. He displays normal reflexes. No cranial nerve deficit or sensory deficit.  Skin: Skin is warm and dry. No rash noted.  Psychiatric: He has a normal mood and affect.    ED Course  Procedures (including critical care time)  Labs Reviewed - No data to display No results found.   No diagnosis found.    MDM  Pt requesting MRI, advised he does not meet criteria for emergent MRI. Will treat pain in the ED. Advised to follow up with a spine doctor for further evaluation.         Charles B. Bernette Mayers, MD 09/24/12 1135

## 2012-09-26 ENCOUNTER — Emergency Department (HOSPITAL_COMMUNITY): Payer: Medicare Other

## 2012-09-26 ENCOUNTER — Emergency Department (HOSPITAL_COMMUNITY)
Admission: EM | Admit: 2012-09-26 | Discharge: 2012-09-26 | Disposition: A | Discharge status: Home / Self Care | Payer: Medicare Other | Attending: Emergency Medicine | Admitting: Emergency Medicine

## 2012-09-26 ENCOUNTER — Encounter (HOSPITAL_COMMUNITY): Payer: Self-pay | Admitting: *Deleted

## 2012-09-26 DIAGNOSIS — M545 Low back pain, unspecified: Secondary | ICD-10-CM | POA: Diagnosis not present

## 2012-09-26 DIAGNOSIS — IMO0001 Reserved for inherently not codable concepts without codable children: Secondary | ICD-10-CM | POA: Diagnosis not present

## 2012-09-26 DIAGNOSIS — F17200 Nicotine dependence, unspecified, uncomplicated: Secondary | ICD-10-CM | POA: Insufficient documentation

## 2012-09-26 MED ORDER — KETOROLAC TROMETHAMINE 60 MG/2ML IM SOLN: 60.0000 mg | Freq: Once | INTRAMUSCULAR | Status: DC

## 2012-09-26 MED ORDER — PREDNISONE 20 MG PO TABS: ORAL_TABLET | ORAL | Status: DC

## 2012-09-26 MED ORDER — KETOROLAC TROMETHAMINE 30 MG/ML IJ SOLN
INTRAMUSCULAR | Status: AC
  Administered 2012-09-26: 60 mg
  Filled 2012-09-26: qty 2

## 2012-09-26 MED ORDER — CYCLOBENZAPRINE HCL 10 MG PO TABS: 10.0000 mg | ORAL_TABLET | Freq: Three times a day (TID) | ORAL | Status: DC | PRN

## 2012-09-26 MED ORDER — CYCLOBENZAPRINE HCL 10 MG PO TABS
10.0000 mg | ORAL_TABLET | Freq: Once | ORAL | Status: AC
  Administered 2012-09-26: 10 mg via ORAL
  Filled 2012-09-26: qty 1

## 2012-09-26 NOTE — ED Notes (Signed)
Pt dc home via w/c to exit.  No acute distress noted.  Pt states understanding to dc instructions.

## 2012-09-26 NOTE — ED Notes (Signed)
Pt states that he has pain in lower back.  Was seen here 3 days ago for same.

## 2012-09-26 NOTE — ED Notes (Signed)
The pt is c/o lower back pain for one week.  hereports that he was seen here in the past week for the same. No new injury

## 2012-09-26 NOTE — ED Provider Notes (Signed)
History     CSN: [id number]  Arrival date & time 09/26/12  2015   First MD Initiated Contact with Patient 09/26/12 2045      Chief Complaint  Patient presents with  . Back Pain    (Consider location/radiation/quality/duration/timing/severity/associated sxs/prior treatment) HPI Comments: 21 y/o male with chronic back pain for the past year presents to the ED with worsening back pain over the past week. He was seen here in the ED 3 days ago for the same reason and given f/u with spine doctor who cannot see him until next week. He was given pain medication in the ED, but at home has just been trying to rest without relief. 1 week ago he was lifting a car seat into the car when he aggravated his back. Pain rated 8/10, worse with any movement. Has pain shooting down both of his legs completely when he walks. No numbness or tingling down extremities. Denies saddle anesthesia or loss of control of bowels or bladder. Admits to associated nausea and vomiting. No fever or chills.   The history is provided by the patient.    Past Medical History  Diagnosis Date  . Chronic back pain greater than 3 months duration     Past Surgical History  Procedure Date  . Wrist fracture surgery     No family history on file.  History  Substance Use Topics  . Smoking status: Current Some Day Smoker  . Smokeless tobacco: Not on file  . Alcohol Use: No      Review of Systems  Constitutional: Positive for activity change. Negative for fever and chills.  HENT: Negative for neck pain and neck stiffness.   Respiratory: Negative for shortness of breath.   Cardiovascular: Negative for chest pain.  Gastrointestinal: Positive for nausea and vomiting.       No bowel dysfunction  Genitourinary: Negative for difficulty urinating.       No bladder or urinary dysfunction  Musculoskeletal: Positive for back pain and gait problem.  Skin: Negative for rash.  Neurological: Negative for numbness.    Psychiatric/Behavioral: The patient is nervous/anxious.     Allergies  Review of patient's allergies indicates no known allergies.  Home Medications   Current Outpatient Rx  Name Route Sig Dispense Refill  . CYCLOBENZAPRINE HCL 10 MG PO TABS Oral Take 10 mg by mouth every 8 (eight) hours as needed. For spasms    . NAPROXEN 500 MG PO TABS Oral Take 500 mg by mouth 2 (two) times daily as needed. For pain    . OXYCODONE-ACETAMINOPHEN 5-325 MG PO TABS Oral Take 1-2 tablets by mouth every 4 (four) hours as needed. For pain      BP 107/50  Pulse 104  Temp 98.3 F (36.8 C) (Oral)  Resp 22  SpO2 98%  Physical Exam  Constitutional: He is oriented to person, place, and time. He appears well-developed.       Overweight, uncomfortable  HENT:  Head: Normocephalic and atraumatic.  Eyes: Conjunctivae normal and EOM are normal.  Neck: Normal range of motion. Neck supple.  Cardiovascular: Normal rate, regular rhythm, normal heart sounds and intact distal pulses.   Pulmonary/Chest: Effort normal and breath sounds normal.  Abdominal: Soft. Bowel sounds are normal. There is no tenderness.  Musculoskeletal:       Lumbar back: He exhibits decreased range of motion, tenderness and bony tenderness (generalized low back tenderness all throughout his lower back not in one specific area). He exhibits no spasm and  normal pulse.  Neurological: He is alert and oriented to person, place, and time. He has normal reflexes. No sensory deficit. Gait (slow, limping) abnormal.  Skin: Skin is warm, dry and intact. No rash noted.    ED Course  Procedures (including critical care time)  Labs Reviewed - No data to display Dg Lumbar Spine Complete  09/26/2012  *RADIOLOGY REPORT*  Clinical Data: Muscles as it is and back pain in the low back for a year.  No known injury.  LUMBAR SPINE - COMPLETE 4+ VIEW  Comparison: None.  Findings: Five lumbar type vertebrae.  Normal alignment of the lumbar vertebrae and facet  joints.  No vertebral compression deformities.  Intervertebral disc space heights are preserved.  No focal bone lesion or bone destruction.  Bone cortex and trabecular architecture appear intact.  IMPRESSION: No displaced fractures.   Original Report Authenticated By: Marlon Pel, M.D.      1. Low back pain       MDM  21 year old male with chronic back pain presenting with acute back pain. He was seen 3 days ago and has an appointment for the spine doctor next week. Asked her about any acute abnormality. No focal neurologic deficits. He is able to ambulate, however this is very slow. No red flags concerning patient's back pain. No s/s of central cord compression or cauda equina. Lower extremities are neurovascularly intact. He was not prescribed any medication upon discharge 3 days ago, so I'll give him a muscle relaxer along with a prednisone taper. Advised him to arrest apply heat. Return precautions discussed.         Trevor Mace, PA-C 09/26/12 2210

## 2012-09-27 NOTE — ED Provider Notes (Signed)
Medical screening examination/treatment/procedure(s) were performed by non-physician practitioner and as supervising physician I was immediately available for consultation/collaboration.  Shamiya Demeritt, MD 09/27/12 0126 

## 2012-10-19 ENCOUNTER — Encounter (HOSPITAL_COMMUNITY): Payer: Self-pay | Admitting: Emergency Medicine

## 2012-10-19 ENCOUNTER — Emergency Department (INDEPENDENT_AMBULATORY_CARE_PROVIDER_SITE_OTHER)
Admission: EM | Admit: 2012-10-19 | Discharge: 2012-10-19 | Disposition: A | Discharge status: Home / Self Care | Payer: Medicare Other | Source: Home / Self Care | Attending: Emergency Medicine | Admitting: Emergency Medicine

## 2012-10-19 DIAGNOSIS — K056 Periodontal disease, unspecified: Secondary | ICD-10-CM | POA: Diagnosis not present

## 2012-10-19 DIAGNOSIS — K069 Disorder of gingiva and edentulous alveolar ridge, unspecified: Secondary | ICD-10-CM | POA: Diagnosis not present

## 2012-10-19 DIAGNOSIS — K089 Disorder of teeth and supporting structures, unspecified: Secondary | ICD-10-CM | POA: Diagnosis not present

## 2012-10-19 DIAGNOSIS — K0889 Other specified disorders of teeth and supporting structures: Secondary | ICD-10-CM

## 2012-10-19 MED ORDER — HYDROCODONE-ACETAMINOPHEN 5-500 MG PO TABS: 1.0000 | ORAL_TABLET | Freq: Four times a day (QID) | ORAL | Status: AC | PRN

## 2012-10-19 MED ORDER — NAPROXEN 500 MG PO TABS: 500.0000 mg | ORAL_TABLET | Freq: Two times a day (BID) | ORAL | Status: AC

## 2012-10-19 MED ORDER — PENICILLIN V POTASSIUM 500 MG PO TABS: 500.0000 mg | ORAL_TABLET | Freq: Three times a day (TID) | ORAL | Status: AC

## 2012-10-19 NOTE — ED Provider Notes (Addendum)
History     CSN: [id number]  Arrival date & time 10/19/12  1258   First MD Initiated Contact with Patient 10/19/12 1341      Chief Complaint  Patient presents with  . Dental Pain    bottom right back tooth pain  puss pocket around tooth    (Consider location/radiation/quality/duration/timing/severity/associated sxs/prior treatment) HPI Comments: Patient presents urgent care complaining of sudden onset of right lower molar pain probably and with a capacity observed around a tooth. He also believes that his mother tooth coming out expressing on his teeth causing him to have more pain. Patient denies any fevers or headaches or chills.  Patient is a 21 y.o. male presenting with tooth pain. The history is provided by the patient.  Dental PainPrimary symptoms do not include headaches, fever or cough. The symptoms began yesterday. The symptoms are unchanged. The symptoms are new. The symptoms occur constantly.  Additional symptoms include: gum swelling, gum tenderness and facial swelling. Additional symptoms do not include: jaw pain, trouble swallowing, smell disturbance, drooling, ear pain, hearing loss, nosebleeds, goiter and fatigue.    Past Medical History  Diagnosis Date  . Chronic back pain greater than 3 months duration     Past Surgical History  Procedure Date  . Wrist fracture surgery     History reviewed. No pertinent family history.  History  Substance Use Topics  . Smoking status: Current Some Day Smoker  . Smokeless tobacco: Not on file  . Alcohol Use: No      Review of Systems  Constitutional: Negative for fever and fatigue.  HENT: Positive for facial swelling. Negative for hearing loss, ear pain, nosebleeds, drooling, trouble swallowing, voice change and sinus pressure.   Respiratory: Negative for cough.   Neurological: Negative for headaches.    Allergies  Review of patient's allergies indicates no known allergies.  Home Medications   Current  Outpatient Rx  Name  Route  Sig  Dispense  Refill  . CYCLOBENZAPRINE HCL 10 MG PO TABS   Oral   Take 10 mg by mouth every 8 (eight) hours as needed. For spasms         . CYCLOBENZAPRINE HCL 10 MG PO TABS   Oral   Take 1 tablet (10 mg total) by mouth 3 (three) times daily as needed for muscle spasms.   30 tablet   0   . HYDROCODONE-ACETAMINOPHEN 5-500 MG PO TABS   Oral   Take 1-2 tablets by mouth every 6 (six) hours as needed for pain.   10 tablet   0   . NAPROXEN 500 MG PO TABS   Oral   Take 1 tablet (500 mg total) by mouth 2 (two) times daily with a meal. For pain   10 tablet   0   . PENICILLIN V POTASSIUM 500 MG PO TABS   Oral   Take 1 tablet (500 mg total) by mouth 3 (three) times daily.   15 tablet   0   . PREDNISONE 20 MG PO TABS      Take 3 tabs PO x 2 days followed by 2 tabs PO x 2 days followed by 1 tab PO x 2 days   12 tablet   0     BP 123/83  Pulse 87  Temp 99.6 F (37.6 C) (Oral)  Resp 16  SpO2 100%  Physical Exam  Nursing note and vitals reviewed. Constitutional: He appears well-developed and well-nourished.  HENT:  Mouth/Throat: Uvula is midline and mucous  membranes are normal. No oropharyngeal exudate or posterior oropharyngeal erythema.    Eyes: Conjunctivae normal are normal.  Neck: Neck supple. No JVD present.  Abdominal: Soft.  Lymphadenopathy:    He has no cervical adenopathy.  Neurological: He is alert.  Skin: No erythema.    ED Course  Procedures (including critical care time)  Labs Reviewed - No data to display No results found.   1. Periodontal disease   2. Toothache       MDM  Toothache- R lower molar- gingival erythema- no obvious signs of an apical abscess- referred to see dentist- 10 tablets of lortab and Rx of penicillin        Jimmie Molly, MD 10/19/12 1517  Jimmie Molly, MD 10/19/12 [phone]

## 2012-10-19 NOTE — ED Notes (Signed)
Pt states that tooth started to ache last night.   Bottom right tooth pain with puss pocket around tooth.  Pt ? Tooth coming in the back pressing on other teeth causing more pain

## 2012-12-24 ENCOUNTER — Emergency Department (INDEPENDENT_AMBULATORY_CARE_PROVIDER_SITE_OTHER)
Admission: EM | Admit: 2012-12-24 | Discharge: 2012-12-24 | Disposition: A | Discharge status: Home / Self Care | Payer: Medicare Other | Source: Home / Self Care | Attending: Emergency Medicine | Admitting: Emergency Medicine

## 2012-12-24 ENCOUNTER — Encounter (HOSPITAL_COMMUNITY): Payer: Self-pay | Admitting: *Deleted

## 2012-12-24 DIAGNOSIS — K044 Acute apical periodontitis of pulpal origin: Secondary | ICD-10-CM

## 2012-12-24 DIAGNOSIS — K047 Periapical abscess without sinus: Secondary | ICD-10-CM

## 2012-12-24 MED ORDER — CLINDAMYCIN HCL 300 MG PO CAPS: 300.0000 mg | ORAL_CAPSULE | Freq: Four times a day (QID) | ORAL | Status: DC

## 2012-12-24 MED ORDER — DICLOFENAC SODIUM 75 MG PO TBEC: 75.0000 mg | DELAYED_RELEASE_TABLET | Freq: Two times a day (BID) | ORAL | Status: DC

## 2012-12-24 MED ORDER — HYDROCODONE-ACETAMINOPHEN 5-325 MG PO TABS
2.0000 | ORAL_TABLET | Freq: Once | ORAL | Status: AC
  Administered 2012-12-24: 2 via ORAL

## 2012-12-24 MED ORDER — HYDROCODONE-ACETAMINOPHEN 5-325 MG PO TABS
ORAL_TABLET | ORAL | Status: AC
  Filled 2012-12-24: qty 2

## 2012-12-24 MED ORDER — HYDROCODONE-ACETAMINOPHEN 5-325 MG PO TABS: ORAL_TABLET | ORAL | Status: DC

## 2012-12-24 NOTE — ED Notes (Signed)
Significant other is driving pt home.

## 2012-12-24 NOTE — ED Provider Notes (Signed)
Chief Complaint  Patient presents with  . Dental Pain    History of Present Illness:   Greg Velasquez is a 22 year old male who presents today with a two-day history of pain in his left lower second molar. He has no history of injury to his tooth and no visible decay. It has been hurting. There's been no swelling internally or externally. He's felt feverish and had a headache. He is difficulty and pain on opening his mouth and on swallowing. He has no trouble breathing. He denies any shortness of breath or chest pain.  Review of Systems:  Other than noted above, the patient denies any of the following symptoms: Systemic:  No fever, chills, sweats or weight loss. ENT:  No headache, ear ache, sore throat, nasal congestion, facial pain, or swelling. Lymphatic:  No adenopathy. Lungs:  No coughing, wheezing or shortness of breath.  PMFSH:  Past medical history, family history, social history, meds, and allergies were reviewed.  Physical Exam:   Vital signs:  BP 132/77  Pulse 87  Temp 98.6 F (37 C) (Oral)  Resp 20  SpO2 100% General:  Alert, oriented, in no distress. ENT:  TMs and canals normal.  Nasal mucosa normal. Mouth exam:  He's got a very active gag reflex, such that was not even able to put a tongue blade inside his mouth. I did get a fairly good look at his teeth are all are apparently in good repair. There is no visible decay or gingival swelling. No collection of pus. There is no swelling the time of the floor the mouth. The airway is widely patent. Neck:  No swelling or adenopathy. Lungs:  Breath sounds clear and equal bilaterally.  No wheezes, rales or rhonchi. Heart:  Regular rhythm.  No gallops or murmers. Skin:  Clear, warm and dry.  Course in Urgent Care Center:   He was given Norco 5/325, 2 tablets by mouth here.  Assessment:  The encounter diagnosis was Dental infection.  Plan:   1.  The following meds were prescribed:   New Prescriptions   CLINDAMYCIN (CLEOCIN) 300 MG  CAPSULE    Take 1 capsule (300 mg total) by mouth 4 (four) times daily.   DICLOFENAC (VOLTAREN) 75 MG EC TABLET    Take 1 tablet (75 mg total) by mouth 2 (two) times daily.   HYDROCODONE-ACETAMINOPHEN (NORCO/VICODIN) 5-325 MG PER TABLET    1 to 2 tabs every 4 to 6 hours as needed for pain.   2.  The patient was instructed in symptomatic care and handouts were given. 3.  The patient was told to return if becoming worse in any way, if no better in 3 or 4 days, and given some red flag symptoms that would indicate earlier return, especially difficulty breathing. 4.  The patient was told to follow up with a dentist as soon as possible.    Reuben Likes, MD 12/24/12 [phone]

## 2012-12-24 NOTE — ED Notes (Signed)
C/O left lower molar pain since 1/11 along with cold sxs.  Does not have dentist.  Has been taking IBU.

## 2013-02-20 ENCOUNTER — Emergency Department (HOSPITAL_COMMUNITY): Payer: Medicare Other

## 2013-02-20 ENCOUNTER — Encounter (HOSPITAL_COMMUNITY): Payer: Self-pay | Admitting: *Deleted

## 2013-02-20 ENCOUNTER — Emergency Department (HOSPITAL_COMMUNITY)
Admission: EM | Admit: 2013-02-20 | Discharge: 2013-02-20 | Disposition: A | Discharge status: Home / Self Care | Payer: Medicare Other | Attending: Emergency Medicine | Admitting: Emergency Medicine

## 2013-02-20 DIAGNOSIS — W219XXA Striking against or struck by unspecified sports equipment, initial encounter: Secondary | ICD-10-CM | POA: Insufficient documentation

## 2013-02-20 DIAGNOSIS — Z8739 Personal history of other diseases of the musculoskeletal system and connective tissue: Secondary | ICD-10-CM | POA: Insufficient documentation

## 2013-02-20 DIAGNOSIS — F17200 Nicotine dependence, unspecified, uncomplicated: Secondary | ICD-10-CM | POA: Insufficient documentation

## 2013-02-20 DIAGNOSIS — S46909A Unspecified injury of unspecified muscle, fascia and tendon at shoulder and upper arm level, unspecified arm, initial encounter: Secondary | ICD-10-CM | POA: Diagnosis not present

## 2013-02-20 DIAGNOSIS — S4980XA Other specified injuries of shoulder and upper arm, unspecified arm, initial encounter: Secondary | ICD-10-CM | POA: Diagnosis not present

## 2013-02-20 DIAGNOSIS — S59909A Unspecified injury of unspecified elbow, initial encounter: Secondary | ICD-10-CM | POA: Diagnosis not present

## 2013-02-20 DIAGNOSIS — G8929 Other chronic pain: Secondary | ICD-10-CM | POA: Insufficient documentation

## 2013-02-20 DIAGNOSIS — M79609 Pain in unspecified limb: Secondary | ICD-10-CM | POA: Diagnosis not present

## 2013-02-20 DIAGNOSIS — S4991XA Unspecified injury of right shoulder and upper arm, initial encounter: Secondary | ICD-10-CM

## 2013-02-20 DIAGNOSIS — Y9372 Activity, wrestling: Secondary | ICD-10-CM | POA: Insufficient documentation

## 2013-02-20 DIAGNOSIS — Y929 Unspecified place or not applicable: Secondary | ICD-10-CM | POA: Insufficient documentation

## 2013-02-20 MED ORDER — IBUPROFEN 800 MG PO TABS: 800.0000 mg | ORAL_TABLET | Freq: Three times a day (TID) | ORAL | Status: DC

## 2013-02-20 MED ORDER — NAPROXEN 250 MG PO TABS
500.0000 mg | ORAL_TABLET | Freq: Once | ORAL | Status: AC
  Administered 2013-02-20: 500 mg via ORAL
  Filled 2013-02-20: qty 2
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
## 2013-02-20 NOTE — ED Notes (Signed)
Patient transported to X-ray 

## 2013-02-20 NOTE — Progress Notes (Signed)
Orthopedic Tech Progress Note Patient Details:  Greg Velasquez 1991/06/19 [id number] Sling immobilizer applied to Right UE. Tolerated well. Ortho Devices Type of Ortho Device: Sling immobilizer Ortho Device/Splint Interventions: Application   Asia R Thompson 02/20/2013, 7:29 AM

## 2013-02-20 NOTE — ED Notes (Signed)
Pt ambulatory at discharge. Sling placed by ortho tech. Pt has no concerns or questions at this time.

## 2013-02-20 NOTE — ED Provider Notes (Signed)
History     CSN: [id number]  Arrival date & time 02/20/13  5621   First MD Initiated Contact with Patient 02/20/13 0540      Chief Complaint  Patient presents with  . Arm Pain    (Consider location/radiation/quality/duration/timing/severity/associated sxs/prior treatment) HPI History provided by patient. History of chronic pain and multiple ED visits for dental pain. Last night was wrestling with his cousin and injured his right arm, elbow and wrist. He presents this morning with ongoing pain that is sharp in quality. No associated swelling or deformity. He has pain with movement of his arm wrist or elbow. No weakness or numbness moderate severity. Past Medical History  Diagnosis Date  . Chronic back pain greater than 3 months duration     Past Surgical History  Procedure Laterality Date  . Wrist fracture surgery      History reviewed. No pertinent family history.  History  Substance Use Topics  . Smoking status: Current Every Day Smoker -- 1.00 packs/day    Types: Cigarettes  . Smokeless tobacco: Not on file  . Alcohol Use: No      Review of Systems  Constitutional: Negative for fever and chills.  HENT: Negative for neck pain and neck stiffness.   Eyes: Negative for pain.  Respiratory: Negative for shortness of breath.   Cardiovascular: Negative for chest pain.  Gastrointestinal: Negative for abdominal pain.  Genitourinary: Negative for flank pain.  Musculoskeletal: Negative for myalgias.  Skin: Negative for rash.  Neurological: Negative for headaches.  All other systems reviewed and are negative.    Allergies  Review of patient's allergies indicates no known allergies.  Home Medications  No current outpatient prescriptions on file.  BP 136/86  Pulse 105  Temp(Src) 97.2 F (36.2 C) (Oral)  Resp 20  SpO2 98%  Physical Exam  Constitutional: He is oriented to person, place, and time. He appears well-developed and well-nourished.  HENT:  Head:  Normocephalic and atraumatic.  Eyes: EOM are normal. Pupils are equal, round, and reactive to light.  Neck: Neck supple.  Cardiovascular: Regular rhythm and intact distal pulses.   Pulmonary/Chest: Effort normal and breath sounds normal. No respiratory distress. He exhibits no tenderness.  Abdominal: Soft. Bowel sounds are normal. He exhibits no distension. There is no tenderness. There is no rebound.  Musculoskeletal: He exhibits no edema.  Right upper extremity with no bony deformity, no swelling, and distal neurovascular intact. Decreased range of motion at the elbow and wrist secondary to pain. No anatomical snuffbox tenderness. When asked where it hurts the most patient waves his arm from his bicep always his hand. No shoulder deformity with good range of motion. No clavicle tenderness or deformity. No scapular tenderness.  Neurological: He is alert and oriented to person, place, and time.  Skin: Skin is warm and dry.    ED Course  Procedures (including critical care time)   Results for orders placed during the hospital encounter of 09/25/11  RAPID STREP SCREEN      Result Value Range   Streptococcus, Group A Screen (Direct) NEGATIVE  NEGATIVE   Dg Forearm Right  02/20/2013  *RADIOLOGY REPORT*  Clinical Data: Fall, right arm pain.  RIGHT FOREARM - 2 VIEW  Comparison: 10/31/2011  Findings: Lateral compression plate and screw fixation of old mid radius and ulna fractures.  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  No evidence of acute fracture or dislocation.  The visualized soft tissues are unremarkable.  IMPRESSION: No evidence of acute fracture or dislocation.  Status post ORIF of old mid radius and ulna fractures.   Original Report Authenticated By: Charline Bills, M.D.    Dg Wrist Complete Right  02/20/2013  *RADIOLOGY REPORT*  Clinical Data: Fall, right arm/wrist pain  RIGHT WRIST - COMPLETE 3+ VIEW  Comparison: 10/10/2011  Findings: No evidence of acute fracture or dislocation.  Old  nonunited ulnar styloid fracture.  Surgical fixation hardware in the mid radius and ulna, incompletely visualized.  The joint spaces are preserved.  The visualized soft tissues are unremarkable.  IMPRESSION: No acute fracture or dislocation is seen.   Original Report Authenticated By: Charline Bills, M.D.    Dg Humerus Right  02/20/2013  *RADIOLOGY REPORT*  Clinical Data: Fall, right arm pain  RIGHT HUMERUS - 2+ VIEW  Comparison: None.  Findings: No fracture or dislocation is seen.  The joint spaces are preserved.  The visualized soft tissues are unremarkable.  IMPRESSION: No fracture or dislocation is seen.   Original Report Authenticated By: Charline Bills, M.D.     Ice. Pain medications.   MDM  Right upper extremity injury and pain with negative x-rays obtained and reviewed as above. Patient with history of chronic back pain - no narcotics provided  Rx for NSAIDs and outpatient referral as needed       Sunnie Nielsen, MD 02/20/13 [phone]

## 2013-04-06 ENCOUNTER — Encounter (HOSPITAL_COMMUNITY): Payer: Self-pay | Admitting: Emergency Medicine

## 2013-04-06 ENCOUNTER — Emergency Department (INDEPENDENT_AMBULATORY_CARE_PROVIDER_SITE_OTHER)
Admission: EM | Admit: 2013-04-06 | Discharge: 2013-04-06 | Disposition: A | Discharge status: Home / Self Care | Payer: Medicare Other | Source: Home / Self Care | Attending: Emergency Medicine | Admitting: Emergency Medicine

## 2013-04-06 DIAGNOSIS — K089 Disorder of teeth and supporting structures, unspecified: Secondary | ICD-10-CM | POA: Diagnosis not present

## 2013-04-06 DIAGNOSIS — K056 Periodontal disease, unspecified: Secondary | ICD-10-CM

## 2013-04-06 DIAGNOSIS — J039 Acute tonsillitis, unspecified: Secondary | ICD-10-CM | POA: Diagnosis not present

## 2013-04-06 DIAGNOSIS — K069 Disorder of gingiva and edentulous alveolar ridge, unspecified: Secondary | ICD-10-CM | POA: Diagnosis not present

## 2013-04-06 DIAGNOSIS — J029 Acute pharyngitis, unspecified: Secondary | ICD-10-CM

## 2013-04-06 DIAGNOSIS — T887XXA Unspecified adverse effect of drug or medicament, initial encounter: Secondary | ICD-10-CM

## 2013-04-06 LAB — POCT RAPID STREP A: Streptococcus, Group A Screen (Direct): NEGATIVE

## 2013-04-06 MED ORDER — HYDROCODONE-ACETAMINOPHEN 5-325 MG PO TABS: ORAL_TABLET | ORAL | Status: DC

## 2013-04-06 MED ORDER — PENICILLIN G BENZATHINE 1200000 UNIT/2ML IM SUSP
INTRAMUSCULAR | Status: AC
  Filled 2013-04-06: qty 2

## 2013-04-06 MED ORDER — HYDROCODONE-ACETAMINOPHEN 5-325 MG PO TABS
1.0000 | ORAL_TABLET | Freq: Once | ORAL | Status: AC
  Administered 2013-04-06: 1 via ORAL

## 2013-04-06 MED ORDER — HYDROCODONE-ACETAMINOPHEN 5-325 MG PO TABS
ORAL_TABLET | ORAL | Status: AC
  Filled 2013-04-06: qty 1

## 2013-04-06 MED ORDER — IBUPROFEN 800 MG PO TABS
ORAL_TABLET | ORAL | Status: AC
  Filled 2013-04-06: qty 1

## 2013-04-06 MED ORDER — IBUPROFEN 800 MG PO TABS
800.0000 mg | ORAL_TABLET | Freq: Once | ORAL | Status: AC
  Administered 2013-04-06: 800 mg via ORAL

## 2013-04-06 MED ORDER — PENICILLIN G BENZATHINE 1200000 UNIT/2ML IM SUSP
1.2000 10*6.[IU] | Freq: Once | INTRAMUSCULAR | Status: AC
  Administered 2013-04-06: 1.2 10*6.[IU] via INTRAMUSCULAR

## 2013-04-06 NOTE — ED Provider Notes (Signed)
Chief Complaint:   Chief Complaint  Patient presents with  . Sore Throat    History of Present Illness:   Greg Velasquez is a 22 year old male who has had a two-day history of severe sore throat, pain with swallowing, felt feverish, chilled, had sweats, headache, aching in his eyes, and the aching in his right ear. He is able to swallow liquids and has no difficulty breathing. He denies any nasal congestion, rhinorrhea, cough, GI symptoms, or swollen glands in his neck. He has not had any known exposure to strep or to mono and no prior history of strep or mono. He smokes a pack of cigarettes a day.  Review of Systems:  Other than as noted above, the patient denies any of the following symptoms. Systemic:  No fever, chills, sweats, fatigue, myalgias, headache, or anorexia. Eye:  No redness, pain or drainage. ENT:  No earache, ear congestion, nasal congestion, sneezing, rhinorrhea, sinus pressure, sinus pain, or post nasal drip. Lungs:  No cough, sputum production, wheezing, shortness of breath, or chest pain. GI:  No abdominal pain, nausea, vomiting, or diarrhea. Skin:  No rash or itching.  PMFSH:  Past medical history, family history, social history, meds, allergies, and nurse's notes were reviewed.  There is no known exposure to strep or mono.  No prior history of step or mono.    Physical Exam:   Vital signs:  BP 127/72  Pulse 104  Temp(Src) 97.5 F (36.4 C) (Oral)  Resp 20  SpO2 98% General:  Alert, in no distress. Eye:  No conjunctival injection or drainage. Lids were normal. ENT:  TMs and canals were normal, without erythema or inflammation.  Nasal mucosa was clear and uncongested, without drainage.  Mucous membranes were moist.  Exam of pharynx reveals tonsils to be moderately and symmetrically enlarged and erythematous without exudate.  There were no oral ulcerations or lesions. Neck:  Supple, no adenopathy, tenderness or mass. Lungs:  No respiratory distress.  Lungs were clear to  auscultation, without wheezes, rales or rhonchi.  Breath sounds were clear and equal bilaterally.  Heart:  Regular rhythm, without gallops, murmers or rubs. Skin:  Clear, warm, and dry, without rash or lesions.  Labs:   Results for orders placed during the hospital encounter of 04/06/13  POCT RAPID STREP A ([facility] ONLY)      Result Value Range   Streptococcus, Group A Screen (Direct) NEGATIVE  NEGATIVE  POCT INFECTIOUS MONO SCREEN      Result Value Range   Mono Screen NEGATIVE  NEGATIVE    Course in Urgent Care Center:   For pain he was given ibuprofen 800 mg and Norco 5/325 by mouth. Thereafter he was given LA Bicillin 1.2 million units IM and tolerated this well without any immediate side effects.  Assessment:  The encounter diagnosis was Tonsillitis.  Both strep and mono were negative, so we'll treat as if her strep.  Plan:   1.  The following meds were prescribed:   New Prescriptions   HYDROCODONE-ACETAMINOPHEN (NORCO/VICODIN) 5-325 MG PER TABLET    1 to 2 tabs every 4 to 6 hours as needed for pain.   2.  The patient was instructed in symptomatic care including hot saline gargles, throat lozenges, infectious precautions, and need to trade out toothbrush. Handouts were given. 3.  The patient was told to return if becoming worse in any way, if no better in 3 or 4 days, and given some red flag symptoms such as increasing  fever, worsening pain, or difficulty swallowing or breathing that would indicate earlier return.    Reuben Likes, MD 04/06/13 1630

## 2013-04-06 NOTE — ED Notes (Signed)
C/o sore throat fever since this AM; hoarse

## 2013-04-09 ENCOUNTER — Encounter (HOSPITAL_COMMUNITY): Payer: Self-pay

## 2013-04-09 ENCOUNTER — Emergency Department (INDEPENDENT_AMBULATORY_CARE_PROVIDER_SITE_OTHER)
Admission: EM | Admit: 2013-04-09 | Discharge: 2013-04-09 | Disposition: A | Discharge status: Home / Self Care | Payer: Medicare Other | Source: Home / Self Care | Attending: Emergency Medicine | Admitting: Emergency Medicine

## 2013-04-09 DIAGNOSIS — T887XXA Unspecified adverse effect of drug or medicament, initial encounter: Secondary | ICD-10-CM

## 2013-04-09 DIAGNOSIS — T50905A Adverse effect of unspecified drugs, medicaments and biological substances, initial encounter: Secondary | ICD-10-CM

## 2013-04-09 DIAGNOSIS — J029 Acute pharyngitis, unspecified: Secondary | ICD-10-CM

## 2013-04-09 MED ORDER — HYDROCODONE-ACETAMINOPHEN 5-325 MG PO TABS: 1.0000 | ORAL_TABLET | Freq: Four times a day (QID) | ORAL | Status: DC | PRN

## 2013-04-09 MED ORDER — PREDNISONE 10 MG PO TABS: 50.0000 mg | ORAL_TABLET | Freq: Every day | ORAL | Status: DC

## 2013-04-09 MED ORDER — CLINDAMYCIN HCL 300 MG PO CAPS: 300.0000 mg | ORAL_CAPSULE | Freq: Three times a day (TID) | ORAL | Status: DC

## 2013-04-09 NOTE — ED Notes (Signed)
Seen  by provider only

## 2013-04-09 NOTE — ED Notes (Deleted)
Unable to assess pt this AM,

## 2013-04-09 NOTE — ED Provider Notes (Signed)
Medical screening examination/treatment/procedure(s) were performed by non-physician practitioner and as supervising physician I was immediately available for consultation/collaboration.  Leslee Home, M.D.   Reuben Likes, MD 04/09/13 [phone]

## 2013-04-09 NOTE — ED Provider Notes (Signed)
History     CSN: [id number]  Arrival date & time 04/09/13  1028   First MD Initiated Contact with Patient 04/09/13 1122      Chief Complaint  Patient presents with  . Sore Throat    (Consider location/radiation/quality/duration/timing/severity/associated sxs/prior treatment) HPI Comments: Pt was seen here on 4/25. Tested neg for strep and mono. Given bicillin LA injection R hip. Pt c/o pain at site of injection. C/o sore throat not improved at all.   Patient is a 22 y.o. male presenting with pharyngitis. The history is provided by the patient.  Sore Throat This is a new problem. Episode onset: 5 days ago. The problem occurs constantly. The problem has not changed since onset.Pertinent negatives include no chest pain. The symptoms are aggravated by swallowing. Nothing relieves the symptoms. Treatments tried: bicillin LA. The treatment provided no relief.    Past Medical History  Diagnosis Date  . Chronic back pain greater than 3 months duration     Past Surgical History  Procedure Laterality Date  . Wrist fracture surgery      History reviewed. No pertinent family history.  History  Substance Use Topics  . Smoking status: Current Every Day Smoker -- 1.00 packs/day    Types: Cigarettes  . Smokeless tobacco: Not on file  . Alcohol Use: No      Review of Systems  Constitutional: Negative for fever and chills.  HENT: Positive for sore throat. Negative for ear pain, congestion, rhinorrhea, trouble swallowing and postnasal drip.   Respiratory: Negative for cough.   Cardiovascular: Negative for chest pain.  Skin:       Pain at site of injection    Allergies  Review of patient's allergies indicates no known allergies.  Home Medications   Current Outpatient Rx  Name  Route  Sig  Dispense  Refill  . clindamycin (CLEOCIN) 300 MG capsule   Oral   Take 1 capsule (300 mg total) by mouth 3 (three) times daily.   30 capsule   0   . HYDROcodone-acetaminophen  (NORCO/VICODIN) 5-325 MG per tablet   Oral   Take 1-2 tablets by mouth every 6 (six) hours as needed for pain.   10 tablet   0   . predniSONE (DELTASONE) 10 MG tablet   Oral   Take 5 tablets (50 mg total) by mouth daily.   25 tablet   0     BP 117/78  Pulse 111  Temp(Src) 98.9 F (37.2 C) (Oral)  Resp 24  SpO2 95%  Physical Exam  Constitutional: He appears well-developed and well-nourished.  Appears uncomfortable  HENT:  Mouth/Throat: Mucous membranes are normal. Posterior oropharyngeal edema and posterior oropharyngeal erythema present. No oropharyngeal exudate or tonsillar abscesses.  Cardiovascular: Regular rhythm.  Tachycardia present.   Pulmonary/Chest: Effort normal and breath sounds normal.  Lymphadenopathy:       Head (right side): Submandibular and tonsillar adenopathy present. No submental adenopathy present.       Head (left side): Submandibular and tonsillar adenopathy present. No submental adenopathy present.    He has no cervical adenopathy.  Skin: Skin is warm, dry and intact. There is erythema.       ED Course  Procedures (including critical care time)  Labs Reviewed - No data to display No results found.   1. Pharyngitis   2. Reaction to shot, initial encounter       MDM  Appears to have had local reaction to bicillin injection. Recommended warm compresses to help  heal. Rx hydrocodone 5/325 #10 for pain at injection site and sore throat. Rx clindamycin 300mg  TID for 10 days and prednisone 50mg  qd for 5 days. Pt to return if no improvement.         Cathlyn Parsons, NP 04/09/13 1146

## 2013-04-24 ENCOUNTER — Emergency Department (HOSPITAL_COMMUNITY): Payer: Medicare Other

## 2013-04-24 ENCOUNTER — Emergency Department (HOSPITAL_COMMUNITY)
Admission: EM | Admit: 2013-04-24 | Discharge: 2013-04-24 | Disposition: A | Discharge status: Home / Self Care | Payer: Medicare Other | Attending: Emergency Medicine | Admitting: Emergency Medicine

## 2013-04-24 ENCOUNTER — Encounter (HOSPITAL_COMMUNITY): Payer: Self-pay | Admitting: Emergency Medicine

## 2013-04-24 DIAGNOSIS — S8990XA Unspecified injury of unspecified lower leg, initial encounter: Secondary | ICD-10-CM | POA: Diagnosis not present

## 2013-04-24 DIAGNOSIS — Z202 Contact with and (suspected) exposure to infections with a predominantly sexual mode of transmission: Secondary | ICD-10-CM | POA: Diagnosis not present

## 2013-04-24 DIAGNOSIS — M79609 Pain in unspecified limb: Secondary | ICD-10-CM | POA: Diagnosis not present

## 2013-04-24 DIAGNOSIS — S99922A Unspecified injury of left foot, initial encounter: Secondary | ICD-10-CM

## 2013-04-24 DIAGNOSIS — Y9389 Activity, other specified: Secondary | ICD-10-CM | POA: Insufficient documentation

## 2013-04-24 DIAGNOSIS — R300 Dysuria: Secondary | ICD-10-CM | POA: Diagnosis not present

## 2013-04-24 DIAGNOSIS — M79675 Pain in left toe(s): Secondary | ICD-10-CM

## 2013-04-24 DIAGNOSIS — F17200 Nicotine dependence, unspecified, uncomplicated: Secondary | ICD-10-CM | POA: Diagnosis not present

## 2013-04-24 DIAGNOSIS — S99929A Unspecified injury of unspecified foot, initial encounter: Secondary | ICD-10-CM | POA: Diagnosis not present

## 2013-04-24 DIAGNOSIS — J029 Acute pharyngitis, unspecified: Secondary | ICD-10-CM | POA: Diagnosis not present

## 2013-04-24 DIAGNOSIS — Y929 Unspecified place or not applicable: Secondary | ICD-10-CM | POA: Insufficient documentation

## 2013-04-24 DIAGNOSIS — G8929 Other chronic pain: Secondary | ICD-10-CM | POA: Insufficient documentation

## 2013-04-24 DIAGNOSIS — IMO0002 Reserved for concepts with insufficient information to code with codable children: Secondary | ICD-10-CM | POA: Insufficient documentation

## 2013-04-24 DIAGNOSIS — Z720 Tobacco use: Secondary | ICD-10-CM

## 2013-04-24 MED ORDER — LIDOCAINE HCL (PF) 1 % IJ SOLN
INTRAMUSCULAR | Status: AC
  Administered 2013-04-24: 0.9 mL
  Filled 2013-04-24: qty 5

## 2013-04-24 MED ORDER — AZITHROMYCIN 250 MG PO TABS
1000.0000 mg | ORAL_TABLET | Freq: Once | ORAL | Status: AC
  Administered 2013-04-24: 1000 mg via ORAL
  Filled 2013-04-24: qty 4

## 2013-04-24 MED ORDER — CEFTRIAXONE SODIUM 250 MG IJ SOLR
250.0000 mg | Freq: Once | INTRAMUSCULAR | Status: AC
  Administered 2013-04-24: 250 mg via INTRAMUSCULAR
  Filled 2013-04-24: qty 250

## 2013-04-24 MED ORDER — IBUPROFEN 800 MG PO TABS: 800.0000 mg | ORAL_TABLET | Freq: Three times a day (TID) | ORAL | Status: DC

## 2013-04-24 MED ORDER — HYDROCODONE-ACETAMINOPHEN 5-325 MG PO TABS
2.0000 | ORAL_TABLET | Freq: Once | ORAL | Status: AC
  Administered 2013-04-24: 2 via ORAL
  Filled 2013-04-24: qty 2

## 2013-04-24 NOTE — ED Provider Notes (Signed)
History  This chart was scribed for non-physician practitioner Junius Finner, PA-C working with Loren Racer, MD, by Candelaria Stagers, ED Scribe. This patient was seen in room TR08C/TR08C and the patient's care was started at 8:29 PM   CSN: [id number]  Arrival date & time 04/24/13  1900   First MD Initiated Contact with Patient 04/24/13 1939      Chief Complaint  Patient presents with  . Toe Pain     The history is provided by the patient. No language interpreter was used.   HPI Comments: Greg Velasquez is a 22 y.o. male who presents to the Emergency Department complaining of sudden onset of moderate left great toe pain that is sore after kicking a wall earlier this morning.  He has taken nothing for the pain.  He is experiencing bruising and redness to the toe.  Pt was in the ED earlier today for treatment of STD.  He did not mention toe pain at that time stating the person he came with demanded a ride home prior to full evaluation.   Past Medical History  Diagnosis Date  . Chronic back pain greater than 3 months duration     Past Surgical History  Procedure Laterality Date  . Wrist fracture surgery      History reviewed. No pertinent family history.  History  Substance Use Topics  . Smoking status: Current Every Day Smoker -- 1.00 packs/day    Types: Cigarettes  . Smokeless tobacco: Not on file  . Alcohol Use: No      Review of Systems  Musculoskeletal: Positive for arthralgias (left great toe pain).  All other systems reviewed and are negative.    Allergies  Review of patient's allergies indicates no known allergies.  Home Medications   Current Outpatient Rx  Name  Route  Sig  Dispense  Refill  . ibuprofen (ADVIL,MOTRIN) 800 MG tablet   Oral   Take 1 tablet (800 mg total) by mouth 3 (three) times daily.   21 tablet   0     BP 126/79  Pulse 110  Temp(Src) 98 F (36.7 C) (Oral)  Resp 16  SpO2 97%  Physical Exam  Nursing note and vitals  reviewed. Constitutional: He is oriented to person, place, and time. He appears well-developed and well-nourished. No distress.  HENT:  Head: Normocephalic and atraumatic.  Eyes: Conjunctivae are normal.  Neck: Normal range of motion. Neck supple.  Cardiovascular: Normal rate, regular rhythm and intact distal pulses.   Pulmonary/Chest: Effort normal. No respiratory distress.  Musculoskeletal: Normal range of motion.  Left great toe ecchymosis and minimal swelling.  Limited flexion and extension due to pain.  Severe tenderness to palpation over the great toe.  Pedal pulses intact.   Neurological: He is alert and oriented to person, place, and time. He has normal strength. No sensory deficit. He displays no seizure activity.  Skin: Skin is warm and dry. He is not diaphoretic.  Skin intact of left great toe.   Psychiatric: He has a normal mood and affect. His behavior is normal.    ED Course  Procedures   DIAGNOSTIC STUDIES: Oxygen Saturation is 97% on room air, normal by my interpretation.    COORDINATION OF CARE:  8:30 PM Discussed course of care with pt which includes images of left great toe.  Will give pain medication.  Pt understands and agrees.   Labs Reviewed - No data to display Dg Foot Complete Left  04/24/2013   *RADIOLOGY REPORT*  Clinical Data: Kicked wall, pain  LEFT FOOT - COMPLETE 3+ VIEW  Comparison: None.  Findings: There is no evidence of fracture or dislocation.  There is no evidence of arthropathy or other focal bony abnormality. Soft tissues are unremarkable.  IMPRESSION: No visible fracture or dislocation.   Original Report Authenticated By: Davonna Belling, M.D.     1. Toe pain, left   2. Toe trauma, left, initial encounter   3. Tobacco abuse       MDM  Pt has bruised left big toe after kicking a wall.  He has not tried anything for pain. CMS in tact, limited flexion and extension of great toe due to pain.  Pt also admitted he was a smoker and knew if it was  broken, it does slow healing time.  Plain films show no fx.  Rx: ibuprofen. Discussed smoking cessation with pt for . Provided smoking cessation pt education packet.  Also provided pt education on musculoskeletal pain.   I personally performed the services described in this documentation, which was scribed in my presence. The recorded information has been reviewed and is accurate.         Junius Finner, PA-C 04/25/13 1132

## 2013-04-24 NOTE — ED Notes (Signed)
Pt c/o left toe pain after kicking the wall last night; pt noted to have bruising to left great toe

## 2013-04-24 NOTE — ED Notes (Signed)
States broke up with girlfriend one month ago and both himself and another ex girlfriend with different partners and are together again.  Patient states girlfriend positive for UTI and gonorrhea. Past week and wants to be treated.  Denies urinary complaints sore throat intermittent.

## 2013-04-24 NOTE — ED Notes (Signed)
Patient not in room. Over head announcement patient had to move his car immediately.

## 2013-04-24 NOTE — ED Provider Notes (Signed)
History  This chart was scribed for non-physician practitioner Junius Finner, PA-C working with Loren Racer, MD, by Candelaria Stagers, ED Scribe. This patient was seen in room TR10C/TR10C and the patient's care was started at 4:52 PM   CSN: [id number]  Arrival date & time 04/24/13  1556   First MD Initiated Contact with Patient 04/24/13 1643      Chief Complaint  Patient presents with  . SEXUALLY TRANSMITTED DISEASE     The history is provided by the patient. No language interpreter was used.   HPI Comments: Greg Velasquez is a 22 y.o. male who presents to the Emergency Department seeking treatment for gonorrhea after his sexual partner reported a positive gonorrhea test yesterday.  Pt reports he is experienicng mild dysuria but denies penile discharge.  Denies rash or lesions of penis or testicles.  Denies testicular pain.  Pt also complains of a sore throat that started several weeks ago.  He reports he was prescribed an antibiotic for the sore throat and has experienced little relief.  Denies fever, n/v/d, abdominal pain, trouble breathing or swallowing.     Past Medical History  Diagnosis Date  . Chronic back pain greater than 3 months duration     Past Surgical History  Procedure Laterality Date  . Wrist fracture surgery      No family history on file.  History  Substance Use Topics  . Smoking status: Current Every Day Smoker -- 1.00 packs/day    Types: Cigarettes  . Smokeless tobacco: Not on file  . Alcohol Use: No      Review of Systems  HENT: Positive for sore throat.   Genitourinary: Positive for dysuria.  All other systems reviewed and are negative.    Allergies  Review of patient's allergies indicates no known allergies.  Home Medications   Current Outpatient Rx  Name  Route  Sig  Dispense  Refill  . ibuprofen (ADVIL,MOTRIN) 800 MG tablet   Oral   Take 1 tablet (800 mg total) by mouth 3 (three) times daily.   21 tablet   0     BP 150/70   Pulse 90  Temp(Src) 97.5 F (36.4 C) (Oral)  Resp 16  Ht 5\' 11"  (1.803 m)  Wt 225 lb (102.059 kg)  BMI 31.39 kg/m2  SpO2 97%  Physical Exam  Nursing note and vitals reviewed. Constitutional: He is oriented to person, place, and time. He appears well-developed and well-nourished. No distress.  Pt lying on exam bed, appears annoyed.  HENT:  Head: Normocephalic and atraumatic.  Mouth/Throat: Oropharynx is clear and moist. No oropharyngeal exudate.  No erythema, or tonsillar edema. No oral or pharyngeal lesions.   Eyes: EOM are normal.  Neck: Normal range of motion. Neck supple. No tracheal deviation present.  Cardiovascular: Normal rate, regular rhythm and normal heart sounds.   Pulmonary/Chest: Effort normal and breath sounds normal. No respiratory distress. He has no wheezes.  Abdominal: Soft. Bowel sounds are normal. He exhibits no distension. There is no tenderness.  Musculoskeletal: Normal range of motion.  Lymphadenopathy:    He has no cervical adenopathy.  Neurological: He is alert and oriented to person, place, and time.  Skin: Skin is warm and dry. He is not diaphoretic.  Psychiatric: He has a normal mood and affect. His behavior is normal.    ED Course  Procedures   DIAGNOSTIC STUDIES: Oxygen Saturation is 97% on room air, normal by my interpretation.    COORDINATION OF CARE:  12:22  PM Discussed course of care with pt which includes treatment for gonorrhea.  Pt understands and agrees.   Labs Reviewed - No data to display Dg Foot Complete Left  04/24/2013   *RADIOLOGY REPORT*  Clinical Data: Kicked wall, pain  LEFT FOOT - COMPLETE 3+ VIEW  Comparison: None.  Findings: There is no evidence of fracture or dislocation.  There is no evidence of arthropathy or other focal bony abnormality. Soft tissues are unremarkable.  IMPRESSION: No visible fracture or dislocation.   Original Report Authenticated By: Davonna Belling, M.D.     1. Exposure to gonorrhea       MDM   Known exposure to gonorrhea.  Pt declined STD testing.  Only wanted to be treated.  Reported minor irritation with urination and sore throat but no penile discharge or lesions.  No penial or testicular pain.  No abdominal pain.   Tx empirically with rocephin and azithromycin   I personally performed the services described in this documentation, which was scribed in my presence. The recorded information has been reviewed and is accurate.        Junius Finner, PA-C 04/25/13 1225

## 2013-04-26 NOTE — ED Provider Notes (Signed)
Medical screening examination/treatment/procedure(s) were performed by non-physician practitioner and as supervising physician I was immediately available for consultation/collaboration.   Allisa Einspahr, MD 04/26/13 0454 

## 2013-04-26 NOTE — ED Provider Notes (Signed)
Medical screening examination/treatment/procedure(s) were performed by non-physician practitioner and as supervising physician I was immediately available for consultation/collaboration.   Zakhari Fogel, MD 04/26/13 0454 
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
## 2013-05-25 ENCOUNTER — Emergency Department (HOSPITAL_COMMUNITY)
Admission: EM | Admit: 2013-05-25 | Discharge: 2013-05-25 | Disposition: A | Discharge status: Home / Self Care | Payer: Medicare Other | Attending: Emergency Medicine | Admitting: Emergency Medicine

## 2013-05-25 ENCOUNTER — Encounter (HOSPITAL_COMMUNITY): Payer: Self-pay | Admitting: Emergency Medicine

## 2013-05-25 DIAGNOSIS — M549 Dorsalgia, unspecified: Secondary | ICD-10-CM | POA: Diagnosis not present

## 2013-05-25 DIAGNOSIS — G8929 Other chronic pain: Secondary | ICD-10-CM | POA: Diagnosis not present

## 2013-05-25 DIAGNOSIS — F17200 Nicotine dependence, unspecified, uncomplicated: Secondary | ICD-10-CM | POA: Diagnosis not present

## 2013-05-25 DIAGNOSIS — K0889 Other specified disorders of teeth and supporting structures: Secondary | ICD-10-CM

## 2013-05-25 DIAGNOSIS — K089 Disorder of teeth and supporting structures, unspecified: Secondary | ICD-10-CM | POA: Diagnosis not present

## 2013-05-25 MED ORDER — OXYCODONE-ACETAMINOPHEN 5-325 MG PO TABS: 1.0000 | ORAL_TABLET | Freq: Four times a day (QID) | ORAL | Status: DC | PRN

## 2013-05-25 NOTE — ED Provider Notes (Signed)
History    This chart was scribed for non-physician practitioner Magnus Sinning, PA-C working with Doug Sou, MD by Toya Smothers, ED Scribe. This patient was seen in room TR06C/TR06C and the patient's care was started at 5:49 PM.   CSN: [id number]  Arrival date & time 05/25/13  1646   First MD Initiated Contact with Patient 05/25/13 1733      Chief Complaint  Patient presents with  . Dental Pain   Patient is a 22 y.o. male presenting with tooth pain. The history is provided by the patient. No language interpreter was used.  Dental Pain Associated symptoms: no fever and no neck pain     HPI Comments: Greg Velasquez is a 22 y.o. male who presents to the Emergency Department complaining of new, gradual onset, dental pain after having his maxillary and mandibular wisdom teeth removed yesterday. Pt was prescribed Vicodin 5-325 mg for pain. Pt reports moderate relief to pain with Rx, but he reports associated itchiness after taking medication. Per Pt, "Percocet won't make me itch." Pt reports that he attempted to contact dental office, but they are closed today. Pt denies headache,  fever, chills, nausea, vomiting,  SOB and any other pain. No difficulty swallowing.  Pt is a current everyday smoker, denying alcohol and illicit drug use.     Past Medical History  Diagnosis Date  . Chronic back pain greater than 3 months duration     Past Surgical History  Procedure Laterality Date  . Wrist fracture surgery      History reviewed. No pertinent family history.  History  Substance Use Topics  . Smoking status: Current Every Day Smoker -- 1.00 packs/day    Types: Cigarettes  . Smokeless tobacco: Not on file  . Alcohol Use: No    Review of Systems  Constitutional: Negative for fever and chills.  HENT: Positive for dental problem. Negative for neck pain and neck stiffness.   All other systems reviewed and are negative.    Allergies  Vicodin  Home Medications   Current  Outpatient Rx  Name  Route  Sig  Dispense  Refill  . HYDROcodone-acetaminophen (NORCO/VICODIN) 5-325 MG per tablet   Oral   Take 1 tablet by mouth every 4 (four) hours as needed for pain.           BP 123/78  Pulse 108  Temp(Src) 98.3 F (36.8 C) (Oral)  Resp 18  Ht 6' (1.829 m)  Wt 265 lb (120.203 kg)  BMI 35.93 kg/m2  SpO2 99%  Physical Exam  Nursing note and vitals reviewed. Constitutional: He is oriented to person, place, and time. He appears well-developed and well-nourished. No distress.  HENT:  Head: Atraumatic. Macrocephalic. No trismus in the jaw.  Mouth/Throat: Uvula is midline, oropharynx is clear and moist and mucous membranes are normal.  No drainage No sign of infection  Eyes: Conjunctivae and EOM are normal.  Neck: Normal range of motion. Neck supple. No tracheal deviation present.  Cardiovascular: Normal rate.   Pulmonary/Chest: Effort normal. No respiratory distress.  Abdominal: He exhibits no distension.  Musculoskeletal: Normal range of motion.  Neurological: He is alert and oriented to person, place, and time. No sensory deficit.  Skin: Skin is dry.  Psychiatric: He has a normal mood and affect. His behavior is normal.    ED Course  Procedures  DIAGNOSTIC STUDIES: Oxygen Saturation is 99% on room air, normal by my interpretation.    COORDINATION OF CARE: 17:47- Evaluated Pt. Pt  is awake, alert, and without distress. 17:51- Patient understands and agrees with initial ED impression and plan with expectations set for ED visit.    Labs Reviewed - No data to display No results found.   No diagnosis found.    MDM  Patient presenting with dental pain after having his wisdom teeth extracted yesterday.  He was prescribed Vicodin, but states that the medication was making him itch.  He is requesting Percocet.  Patient given short course of Percocet and instructed to follow up with Oral Surgeon on Monday.  No signs of infection at this  time.      Pascal Lux [place], PA-C 05/26/13 1415

## 2013-05-25 NOTE — ED Notes (Signed)
Patient presents to ED today with c/o dental pain fro having wisdom teeth pulled yesterday and itching from Vicodin.

## 2013-05-26 NOTE — ED Provider Notes (Signed)
Medical screening examination/treatment/procedure(s) were performed by non-physician practitioner and as supervising physician I was immediately available for consultation/collaboration.  Adore Kithcart, MD 05/26/13 1451 

## 2013-06-26 ENCOUNTER — Encounter (HOSPITAL_COMMUNITY): Payer: Self-pay | Admitting: Emergency Medicine

## 2013-06-26 ENCOUNTER — Emergency Department (INDEPENDENT_AMBULATORY_CARE_PROVIDER_SITE_OTHER)
Admission: EM | Admit: 2013-06-26 | Discharge: 2013-06-26 | Disposition: A | Discharge status: Home / Self Care | Payer: Medicare Other | Source: Home / Self Care

## 2013-06-26 DIAGNOSIS — L03119 Cellulitis of unspecified part of limb: Secondary | ICD-10-CM | POA: Diagnosis not present

## 2013-06-26 DIAGNOSIS — L03116 Cellulitis of left lower limb: Secondary | ICD-10-CM

## 2013-06-26 DIAGNOSIS — L02419 Cutaneous abscess of limb, unspecified: Secondary | ICD-10-CM

## 2013-06-26 MED ORDER — TRAMADOL HCL 50 MG PO TABS: ORAL_TABLET | ORAL | Status: DC

## 2013-06-26 MED ORDER — DICLOFENAC POTASSIUM 50 MG PO TABS: 50.0000 mg | ORAL_TABLET | Freq: Three times a day (TID) | ORAL | Status: DC

## 2013-06-26 MED ORDER — SULFAMETHOXAZOLE-TRIMETHOPRIM 800-160 MG PO TABS: 1.0000 | ORAL_TABLET | Freq: Two times a day (BID) | ORAL | Status: DC

## 2013-06-26 MED ORDER — CLINDAMYCIN HCL 300 MG PO CAPS: 300.0000 mg | ORAL_CAPSULE | Freq: Three times a day (TID) | ORAL | Status: DC

## 2013-06-26 NOTE — ED Provider Notes (Signed)
Medical screening examination/treatment/procedure(s) were performed by resident physician or non-physician practitioner and as supervising physician I was immediately available for consultation/collaboration.   Aki Abalos DOUGLAS MD.   Philomena Buttermore D Cebastian Neis, MD 06/26/13 1741 

## 2013-06-26 NOTE — ED Notes (Signed)
Discharge delayed due to emergent patient needing to be triaged

## 2013-06-26 NOTE — ED Notes (Signed)
Pt c/o spider bite on right posterior thigh x 2 days. Pt reports he felt weak and tired after. No fever. Slight lack of appetite. Pt is alert and oriented.

## 2013-06-26 NOTE — ED Provider Notes (Signed)
History    CSN: [id number] Arrival date & time 06/26/13  1010  First MD Initiated Contact with Patient 06/26/13 1041     Chief Complaint  Patient presents with  . Insect Bite   (Consider location/radiation/quality/duration/timing/severity/associated sxs/prior Treatment) HPI Comments: 22 year old morbidly obese male presents with pain and redness to the right posterior thigh. This occurred yesterday. He initially claimed it was a spider bite. He never saw a spider or any type of insect. There is an approximately 15 x 12 cm area of erythema with a single central pustule to the posterior thigh. He is complaining of pain and tenderness to this area. Denies systemic symptoms. It is noteworthy that he has been to the emergency department at least once if not twice a month for some type of pain issue. He has received at least 4 prescriptions for narcotics since April. He has a history of chronic back pain for which she is also visited the emergency department in the past year.   Past Medical History  Diagnosis Date  . Chronic back pain greater than 3 months duration    Past Surgical History  Procedure Laterality Date  . Wrist fracture surgery     History reviewed. No pertinent family history. History  Substance Use Topics  . Smoking status: Current Every Day Smoker -- 1.00 packs/day    Types: Cigarettes  . Smokeless tobacco: Not on file  . Alcohol Use: No    Review of Systems  Constitutional: Negative.   Gastrointestinal: Negative.   Genitourinary: Negative.   Skin: Positive for color change. Negative for rash.  Neurological: Negative.     Allergies  Vicodin  Home Medications   Current Outpatient Rx  Name  Route  Sig  Dispense  Refill  . clindamycin (CLEOCIN) 300 MG capsule   Oral   Take 1 capsule (300 mg total) by mouth 3 (three) times daily.   30 capsule   0   . diclofenac (CATAFLAM) 50 MG tablet   Oral   Take 1 tablet (50 mg total) by mouth 3 (three) times daily.  Take with food   21 tablet   0   . HYDROcodone-acetaminophen (NORCO/VICODIN) 5-325 MG per tablet   Oral   Take 1 tablet by mouth every 4 (four) hours as needed for pain.         Marland Kitchen oxyCODONE-acetaminophen (PERCOCET/ROXICET) 5-325 MG per tablet   Oral   Take 1-2 tablets by mouth every 6 (six) hours as needed for pain.   15 tablet   0   . sulfamethoxazole-trimethoprim (SEPTRA DS) 800-160 MG per tablet   Oral   Take 1 tablet by mouth every 12 (twelve) hours.   14 tablet   0   . traMADol (ULTRAM) 50 MG tablet      2 tablets q 4-6 hours prn pain   20 tablet   0    BP 117/78  Pulse 90  Temp(Src) 98.3 F (36.8 C) (Oral)  Resp 14  SpO2 96% Physical Exam  Nursing note and vitals reviewed. Constitutional: He is oriented to person, place, and time. He appears well-developed and well-nourished. No distress.  Neck: Normal range of motion. Neck supple.  Cardiovascular: Normal rate.   Pulmonary/Chest: Effort normal. No respiratory distress.  Musculoskeletal: Normal range of motion. He exhibits no edema.  Neurological: He is alert and oriented to person, place, and time. He exhibits normal muscle tone.  Skin: There is erythema.  The posterior thigh with a large area of light  erythema and superficial induration. I am unable to palpate an area of  induration which may represent a particular abscess. No lymphangitis. There is a small single central pustule.    ED Course  Procedures (including critical care time) Labs Reviewed - No data to display No results found. 1. Cellulitis of left thigh     MDM  The patient has a large area of cellulitis without apparent abscess formation. He will be treated with combination of Septra and clindamycin. For pain he will have tramadol and Cataflam. Due to his frequency of visits to the emergency department for various and frequent acute and chronic pain we will withhold narcotic pain medicine at this time. Since this is a rather large area of  erythema he is instructed to go to the emergency department if he is getting worse.  Hayden Rasmussen, NP 06/26/13 1209

## 2013-11-08 ENCOUNTER — Encounter (HOSPITAL_COMMUNITY): Payer: Self-pay | Admitting: Emergency Medicine

## 2013-11-08 ENCOUNTER — Emergency Department (HOSPITAL_COMMUNITY): Payer: Medicare Other

## 2013-11-08 ENCOUNTER — Emergency Department (HOSPITAL_COMMUNITY)
Admission: EM | Admit: 2013-11-08 | Discharge: 2013-11-08 | Disposition: A | Discharge status: Home / Self Care | Payer: Medicare Other | Attending: Emergency Medicine | Admitting: Emergency Medicine

## 2013-11-08 DIAGNOSIS — S46909A Unspecified injury of unspecified muscle, fascia and tendon at shoulder and upper arm level, unspecified arm, initial encounter: Secondary | ICD-10-CM | POA: Insufficient documentation

## 2013-11-08 DIAGNOSIS — W1801XA Striking against sports equipment with subsequent fall, initial encounter: Secondary | ICD-10-CM | POA: Insufficient documentation

## 2013-11-08 DIAGNOSIS — M25539 Pain in unspecified wrist: Secondary | ICD-10-CM | POA: Diagnosis not present

## 2013-11-08 DIAGNOSIS — G8929 Other chronic pain: Secondary | ICD-10-CM | POA: Diagnosis not present

## 2013-11-08 DIAGNOSIS — F17200 Nicotine dependence, unspecified, uncomplicated: Secondary | ICD-10-CM | POA: Diagnosis not present

## 2013-11-08 DIAGNOSIS — S4991XA Unspecified injury of right shoulder and upper arm, initial encounter: Secondary | ICD-10-CM

## 2013-11-08 DIAGNOSIS — M25529 Pain in unspecified elbow: Secondary | ICD-10-CM | POA: Diagnosis not present

## 2013-11-08 DIAGNOSIS — Z791 Long term (current) use of non-steroidal anti-inflammatories (NSAID): Secondary | ICD-10-CM | POA: Diagnosis not present

## 2013-11-08 DIAGNOSIS — S4980XA Other specified injuries of shoulder and upper arm, unspecified arm, initial encounter: Secondary | ICD-10-CM | POA: Diagnosis not present

## 2013-11-08 DIAGNOSIS — M25519 Pain in unspecified shoulder: Secondary | ICD-10-CM | POA: Diagnosis not present

## 2013-11-08 DIAGNOSIS — S59909A Unspecified injury of unspecified elbow, initial encounter: Secondary | ICD-10-CM | POA: Diagnosis not present

## 2013-11-08 DIAGNOSIS — Y9361 Activity, american tackle football: Secondary | ICD-10-CM | POA: Insufficient documentation

## 2013-11-08 DIAGNOSIS — Y9239 Other specified sports and athletic area as the place of occurrence of the external cause: Secondary | ICD-10-CM | POA: Insufficient documentation

## 2013-11-08 MED ORDER — OXYCODONE-ACETAMINOPHEN 5-325 MG PO TABS
2.0000 | ORAL_TABLET | Freq: Once | ORAL | Status: AC
  Administered 2013-11-08: 2 via ORAL
  Filled 2013-11-08: qty 2

## 2013-11-08 MED ORDER — IBUPROFEN 800 MG PO TABS: 800.0000 mg | ORAL_TABLET | Freq: Three times a day (TID) | ORAL | Status: DC

## 2013-11-08 MED ORDER — IBUPROFEN 400 MG PO TABS
800.0000 mg | ORAL_TABLET | Freq: Once | ORAL | Status: AC
  Administered 2013-11-08: 800 mg via ORAL
  Filled 2013-11-08: qty 2
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
## 2013-11-08 NOTE — ED Notes (Signed)
Pt st's he was playing football earlier today and injured right arm.  C/o pain to right forearm and right shoulder.

## 2013-11-12 NOTE — ED Provider Notes (Signed)
Medical screening examination/treatment/procedure(s) were performed by non-physician practitioner and as supervising physician I was immediately available for consultation/collaboration.  EKG Interpretation   None         Charles B. Sheldon, MD 11/12/13 0743 

## 2013-12-22 ENCOUNTER — Encounter (HOSPITAL_COMMUNITY): Payer: Self-pay | Admitting: Emergency Medicine

## 2013-12-22 ENCOUNTER — Emergency Department (INDEPENDENT_AMBULATORY_CARE_PROVIDER_SITE_OTHER)
Admission: EM | Admit: 2013-12-22 | Discharge: 2013-12-22 | Disposition: A | Discharge status: Home / Self Care | Payer: Medicare Other | Source: Home / Self Care | Attending: Emergency Medicine | Admitting: Emergency Medicine

## 2013-12-22 DIAGNOSIS — K047 Periapical abscess without sinus: Secondary | ICD-10-CM

## 2013-12-22 MED ORDER — CLINDAMYCIN HCL 300 MG PO CAPS: 300.0000 mg | ORAL_CAPSULE | Freq: Four times a day (QID) | ORAL | Status: DC

## 2013-12-22 MED ORDER — MELOXICAM 15 MG PO TABS: 15.0000 mg | ORAL_TABLET | Freq: Every day | ORAL | Status: DC

## 2013-12-22 MED ORDER — OXYCODONE-ACETAMINOPHEN 5-325 MG PO TABS: ORAL_TABLET | ORAL | Status: DC

## 2013-12-22 MED ORDER — DIPHENHYDRAMINE HCL 25 MG PO CAPS
ORAL_CAPSULE | ORAL | Status: AC
  Filled 2013-12-22: qty 1

## 2013-12-22 MED ORDER — HYDROCODONE-ACETAMINOPHEN 5-325 MG PO TABS
2.0000 | ORAL_TABLET | Freq: Once | ORAL | Status: AC
  Administered 2013-12-22: 2 via ORAL

## 2013-12-22 MED ORDER — HYDROCODONE-ACETAMINOPHEN 5-325 MG PO TABS
ORAL_TABLET | ORAL | Status: AC
  Filled 2013-12-22: qty 2

## 2013-12-22 MED ORDER — DIPHENHYDRAMINE HCL 25 MG PO CAPS
25.0000 mg | ORAL_CAPSULE | Freq: Once | ORAL | Status: AC
  Administered 2013-12-22: 25 mg via ORAL

## 2013-12-22 NOTE — ED Provider Notes (Signed)
Chief Complaint:   Chief Complaint  Patient presents with  . Dental Pain    History of Present Illness:   Greg Velasquez is a 23 year old male who has had a three-day history of severe toothache in his right, lower, second molar. He had his wisdom teeth pulled several months ago. He had some remaining cavities, but did not followup with a dentist. The tooth is sensitive to cold air and cold liquids. It hurts to chew on that side. It hurts to open his mouth but he is able to open fully. No problem swallowing or breathing. He has some headache and right ear pain. The tooth makes his whole side of his face hurt. He denies any pain or swelling in the neck or shortness of breath. He denies any fever.  Review of Systems:  Other than noted above, the patient denies any of the following symptoms: Systemic:  No fever, chills,  Or sweats. ENT:  No headache, ear ache, sore throat, nasal congestion, facial pain, or swelling. Lymphatic:  No adenopathy. Lungs:  No coughing, wheezing or shortness of breath.  PMFSH:  Past medical history, family history, social history, meds, and allergies were reviewed.  Physical Exam:   Vital signs:  BP 156/84  Pulse 90  Temp(Src) 98.1 F (36.7 C) (Oral)  Resp 16  SpO2 98% General:  Alert, oriented, in no distress. ENT:  TMs and canals normal.  Nasal mucosa normal. Mouth exam:  He has pain when he opens his mouth. The lower right first and second molars are intact without any evidence of decay. The second molar is tender to palpation. There is no gingival collection of pus or gingival swelling. No swelling of the tongue or the floor the mouth. The pharynx is clear and widely patent. Remainder the teeth are in good repair. Neck:  No swelling or adenopathy. Lungs:  Breath sounds clear and equal bilaterally.  No wheezes, rales or rhonchi. Heart:  Regular rhythm.  No gallops or murmers. Skin:  Clear, warm and dry.   Course in Urgent Care Center:   The patient states that  he has mild itching with hydrocodone. He states he can take hydrocodone with Benadryl, so he was given Norco 5/325 2 for pain plus Benadryl 25 mg by mouth.  Assessment:  The encounter diagnosis was Dental abscess.  Plan:   1.  Meds:  The following meds were prescribed:   New Prescriptions   CLINDAMYCIN (CLEOCIN) 300 MG CAPSULE    Take 1 capsule (300 mg total) by mouth 4 (four) times daily.   MELOXICAM (MOBIC) 15 MG TABLET    Take 1 tablet (15 mg total) by mouth daily.   OXYCODONE-ACETAMINOPHEN (PERCOCET) 5-325 MG PER TABLET    1 to 2 tablets every 6 hours as needed for pain.    2.  Patient Education/Counseling:  The patient was given appropriate handouts, self care instructions, and instructed in symptomatic relief. Suggested sleeping with head of bed elevated and hot salt water mouthwash.   3.  Follow up:  The patient was told to follow up if no better in 3 to 4 days, if becoming worse in any way, and given some red flag symptoms such as difficulty swallowing or breathing which would prompt immediate return.  Follow up with a dentist as soon as posssible.     Reuben Likesavid C Matan Steen, MD 12/22/13 [id number]

## 2013-12-22 NOTE — Discharge Instructions (Signed)
Look up the [employer]'s [facility] for free dental clinics. Http://www.ncdental.org/ncds/Schedule.asp ° °Get there early and be prepared to wait. [employer] and [employer] have dental hygienist schools that provide low cost routine dental care.  ° °Other resources: °[facility] °[address] °[place], [place] °[phone] ° °Patients with Medicaid: °[facility]                     [facility] °[address]                                [address] °Phone:  [phone]                                                  Phone:  [phone] ° °If unable to pay or uninsured, contact:  [facility] or [facility] to become qualified for the adult dental clinic. ° °No matter what dental problem you have, it will not get better unless you get good dental care.  If the tooth is not taken care of, your symptoms will come back in time and you will be visiting us again in the Urgent Care Center with a bad toothache.  So, see your dentist as soon as possible.  If you don't have a dentist, we can give you a list of dentists.  Sometimes the most cost effective treatment is removal of the tooth.  This can be done very inexpensively through one of the low cost [facility] Centers such as the facility on [address] in [place] (1-800-336-8873).  The downside to this is that you will have one less tooth and this can effect your ability to chew. ° °Some other things that can be done for a dental infection include the following: ° °· Rinse your mouth out with hot salt water (1/2 tsp of table salt and a pinch of baking soda in 8 oz of hot water).  You can do this every 2 or 3 hours. °· Avoid cold foods, beverages, and cold air.  This will make your symptoms worse. °· Sleep with your head elevated.  Sleeping flat will cause your gums and oral tissues to swell and make them hurt more.  You can sleep on several pillows.  Even  better is to sleep in a recliner with your head higher than your heart. °· For mild to moderate pain, you can take Tylenol, ibuprofen, or Aleve. °· External application of heat by a heating pad, hot water bottle, or hot wet towel can help with pain and speed healing.  You can do this every 2 to 3 hours. Do not fall asleep on a heating pad since this can cause a burn.  °·  °

## 2013-12-22 NOTE — ED Notes (Signed)
Right, bottom tooth pain.  Onset 3 days ago.  Patient reports all wisdom teeth were pulled several months ago, new he had cavities remaining , but did not follow up with dentist.

## 2014-01-04 ENCOUNTER — Encounter (HOSPITAL_COMMUNITY): Payer: Self-pay | Admitting: Emergency Medicine

## 2014-01-04 ENCOUNTER — Emergency Department (INDEPENDENT_AMBULATORY_CARE_PROVIDER_SITE_OTHER)
Admission: EM | Admit: 2014-01-04 | Discharge: 2014-01-04 | Discharge status: Against Medical Advice | Payer: Medicare Other | Source: Home / Self Care

## 2014-01-04 DIAGNOSIS — Z765 Malingerer [conscious simulation]: Secondary | ICD-10-CM

## 2014-01-04 DIAGNOSIS — K089 Disorder of teeth and supporting structures, unspecified: Secondary | ICD-10-CM

## 2014-01-04 DIAGNOSIS — F1910 Other psychoactive substance abuse, uncomplicated: Secondary | ICD-10-CM

## 2014-01-04 DIAGNOSIS — K0889 Other specified disorders of teeth and supporting structures: Secondary | ICD-10-CM

## 2014-01-04 NOTE — ED Notes (Signed)
Reportedly has missed his follow up appointment, and has rescheduled to see oral surgeon next week. Has appointment sheet with him showing appointment

## 2014-01-04 NOTE — ED Provider Notes (Signed)
Medical screening examination/treatment/procedure(s) were performed by resident physician or non-physician practitioner and as supervising physician I was immediately available for consultation/collaboration.   Keveon Amsler DOUGLAS MD.   Caleb Decock D Kadedra Vanaken, MD 01/04/14 1307 

## 2014-01-04 NOTE — ED Provider Notes (Signed)
CSN: [id number]     Arrival date & time 01/04/14  1133 History   First MD Initiated Contact with Patient 01/04/14 1233     Chief Complaint  Patient presents with  . Dental Problem   (Consider location/radiation/quality/duration/timing/severity/associated sxs/prior Treatment) HPI Comments: 23 year old male presents to the urgent care for the second time requesting pain medication for tooth pain. He was seen here approximately 10 days ago and was given Percocet. He states that he is having bad tooth pain which is giving him a migraine. [facility] who can refer him to an oral surgeon. He states he has an appointment with him in 4 days.  In attempt to examine his tooth corner of his mouth was touched with the tongue blade and he started gagging. This attempt was made several times and he was gagging himself. Approaching his mouth with the tongue blade and without touching anything he started gagging himself. Unable to examine the tooth. The patient became upset that and walked out. He states that he was going to leave now before he does something that he he may regret later.   Past Medical History  Diagnosis Date  . Chronic back pain greater than 3 months duration    Past Surgical History  Procedure Laterality Date  . Wrist fracture surgery     History reviewed. No pertinent family history. History  Substance Use Topics  . Smoking status: Current Every Day Smoker -- 1.00 packs/day    Types: Cigarettes  . Smokeless tobacco: Not on file  . Alcohol Use: No    Review of Systems  Allergies  Vicodin  Home Medications   Current Outpatient Rx  Name  Route  Sig  Dispense  Refill  . acetaminophen (TYLENOL) 325 MG tablet   Oral   Take 650 mg by mouth every 6 (six) hours as needed.         . clindamycin (CLEOCIN) 300 MG capsule   Oral   Take 1 capsule (300 mg total) by mouth 4 (four) times daily.   40 capsule   0   . ibuprofen (ADVIL,MOTRIN) 800 MG  tablet   Oral   Take 1 tablet (800 mg total) by mouth 3 (three) times daily.   21 tablet   0   . meloxicam (MOBIC) 15 MG tablet   Oral   Take 1 tablet (15 mg total) by mouth daily.   15 tablet   0   . oxyCODONE-acetaminophen (PERCOCET) 5-325 MG per tablet      1 to 2 tablets every 6 hours as needed for pain.   20 tablet   0    BP 127/82  Pulse 81  Temp(Src) 97.7 F (36.5 C) (Oral)  Resp 18  SpO2 98% Physical Exam  ED Course  Procedures (including critical care time) Labs Review Labs Reviewed - No data to display Imaging Review No results found.    MDM   1. Drug-seeking behavior   2. Toothache      Unable to examine pt due to self gagging and pushing my hands away. What little time I was able to see his Right lower teeth there was no erythema or swelling.  St he needed dental services and told to come here. Advised pt we have no dentists here. Is out of his Percocet and wanted more pain medicine.  Behavior [facility] and [facility] suggests drug seeking behavior.   Hayden Rasmussenavid Lothar Prehn, NP 01/04/14 1302

## 2014-05-29 ENCOUNTER — Emergency Department (INDEPENDENT_AMBULATORY_CARE_PROVIDER_SITE_OTHER)
Admission: EM | Admit: 2014-05-29 | Discharge: 2014-05-29 | Disposition: A | Discharge status: Home / Self Care | Payer: Medicare Other | Source: Home / Self Care | Attending: Family Medicine | Admitting: Family Medicine

## 2014-05-29 ENCOUNTER — Other Ambulatory Visit (HOSPITAL_COMMUNITY)
Admission: RE | Admit: 2014-05-29 | Discharge: 2014-05-29 | Disposition: A | Discharge status: Home / Self Care | Payer: Medicare Other | Source: Ambulatory Visit | Attending: Family Medicine | Admitting: Family Medicine

## 2014-05-29 ENCOUNTER — Encounter (HOSPITAL_COMMUNITY): Payer: Self-pay | Admitting: Emergency Medicine

## 2014-05-29 DIAGNOSIS — R300 Dysuria: Secondary | ICD-10-CM | POA: Diagnosis present

## 2014-05-29 DIAGNOSIS — L0291 Cutaneous abscess, unspecified: Secondary | ICD-10-CM | POA: Diagnosis not present

## 2014-05-29 DIAGNOSIS — N342 Other urethritis: Secondary | ICD-10-CM | POA: Diagnosis not present

## 2014-05-29 DIAGNOSIS — L03119 Cellulitis of unspecified part of limb: Secondary | ICD-10-CM | POA: Diagnosis not present

## 2014-05-29 DIAGNOSIS — G8929 Other chronic pain: Secondary | ICD-10-CM | POA: Diagnosis present

## 2014-05-29 DIAGNOSIS — F17200 Nicotine dependence, unspecified, uncomplicated: Secondary | ICD-10-CM | POA: Diagnosis present

## 2014-05-29 DIAGNOSIS — L255 Unspecified contact dermatitis due to plants, except food: Secondary | ICD-10-CM | POA: Diagnosis present

## 2014-05-29 DIAGNOSIS — Z113 Encounter for screening for infections with a predominantly sexual mode of transmission: Secondary | ICD-10-CM | POA: Insufficient documentation

## 2014-05-29 DIAGNOSIS — R21 Rash and other nonspecific skin eruption: Secondary | ICD-10-CM | POA: Diagnosis not present

## 2014-05-29 DIAGNOSIS — L089 Local infection of the skin and subcutaneous tissue, unspecified: Secondary | ICD-10-CM | POA: Diagnosis present

## 2014-05-29 DIAGNOSIS — L0390 Cellulitis, unspecified: Secondary | ICD-10-CM | POA: Diagnosis not present

## 2014-05-29 DIAGNOSIS — M549 Dorsalgia, unspecified: Secondary | ICD-10-CM | POA: Diagnosis present

## 2014-05-29 DIAGNOSIS — L237 Allergic contact dermatitis due to plants, except food: Secondary | ICD-10-CM

## 2014-05-29 DIAGNOSIS — L02419 Cutaneous abscess of limb, unspecified: Secondary | ICD-10-CM | POA: Diagnosis not present

## 2014-05-29 MED ORDER — CEFTRIAXONE SODIUM 250 MG IJ SOLR
1000.0000 mg | Freq: Once | INTRAMUSCULAR | Status: AC
  Administered 2014-05-29: 1000 mg via INTRAMUSCULAR

## 2014-05-29 MED ORDER — CEPHALEXIN 500 MG PO CAPS: 500.0000 mg | ORAL_CAPSULE | Freq: Four times a day (QID) | ORAL | Status: DC

## 2014-05-29 MED ORDER — DOXYCYCLINE HYCLATE 100 MG PO CAPS: 100.0000 mg | ORAL_CAPSULE | Freq: Two times a day (BID) | ORAL | Status: DC

## 2014-05-29 MED ORDER — LIDOCAINE HCL (PF) 1 % IJ SOLN
INTRAMUSCULAR | Status: AC
  Filled 2014-05-29: qty 5

## 2014-05-29 MED ORDER — DICLOFENAC SODIUM 50 MG PO TBEC: 50.0000 mg | DELAYED_RELEASE_TABLET | Freq: Two times a day (BID) | ORAL | Status: DC | PRN

## 2014-05-29 MED ORDER — CEFTRIAXONE SODIUM 250 MG IJ SOLR
INTRAMUSCULAR | Status: AC
  Filled 2014-05-29: qty 250

## 2014-05-29 MED ORDER — CEFTRIAXONE SODIUM 1 G IJ SOLR
INTRAMUSCULAR | Status: AC
  Filled 2014-05-29: qty 10

## 2014-05-29 MED ORDER — AZITHROMYCIN 250 MG PO TABS
1000.0000 mg | ORAL_TABLET | Freq: Once | ORAL | Status: AC
  Administered 2014-05-29: 1000 mg via ORAL

## 2014-05-29 MED ORDER — PREDNISONE 5 MG PO KIT: PACK | ORAL | Status: DC

## 2014-05-29 MED ORDER — AZITHROMYCIN 250 MG PO TABS
ORAL_TABLET | ORAL | Status: AC
  Filled 2014-05-29: qty 4

## 2014-05-29 NOTE — Discharge Instructions (Signed)
Thank you for coming in today. 1) Arm Rash: Probably poison ivy or poison oak. Take the prednisone pills as directed for 12 days. Use [place] Itch as needed for temporary control of itching. 2) left leg pain: I am concerned you have a skin infection called cellulitis or folliculitis. Take doxycycline and Keflex antibiotics as directed for 10 days. Doxycycline will increase your chance of getting a sunburn so use plenty of sunscreen while outside.  3) burning with urination: Medications given today should help. Come back as needed. I recommend you get an HIV and syphilis test in the near future.  Return tomorrow if not getting better.  Call or go to the emergency room if you get worse, have trouble breathing, have chest pains, or palpitations.    Cellulitis Cellulitis is an infection of the skin and the tissue under the skin. The infected area is usually red and tender. This happens most often in the arms and lower legs. HOME CARE   Take your antibiotic medicine as told. Finish the medicine even if you start to feel better.  Keep the infected arm or leg raised (elevated).  Put a warm cloth on the area up to 4 times per day.  Only take medicines as told by your doctor.  Keep all doctor visits as told. GET HELP RIGHT AWAY IF:   You have a fever.  You feel very sleepy.  You throw up (vomit) or have watery poop (diarrhea).  You feel sick and have muscle aches and pains.  You see red streaks on the skin coming from the infected area.  Your red area gets bigger or turns a dark color.  Your bone or joint under the infected area is painful after the skin heals.  Your infection comes back in the same area or different area.  You have a puffy (swollen) bump in the infected area.  You have new symptoms. MAKE SURE YOU:   Understand these instructions.  Will watch your condition.  Will get help right away if you are not doing well or get worse. Document Released: 05/17/2008  Document Revised: 05/30/2012 Document Reviewed: 02/14/2012 [facility] Patient Information 2015 [place], [place]. This information is not intended to replace advice given to you by your health care provider. Make sure you discuss any questions you have with your health care provider.  Poison [employer] Poison ivy is a rash caused by touching the leaves of the poison ivy plant. The rash often shows up 48 hours later. You might just have bumps, redness, and itching. Sometimes, blisters appear and break open. Your eyes may get puffy (swollen). Poison ivy often heals in 2 to 3 weeks without treatment. HOME CARE  If you touch poison ivy:  Wash your skin with soap and water right away. Wash under your fingernails. Do not rub the skin very hard.  Wash any clothes you were wearing.  Avoid poison ivy in the future. Poison ivy has 3 leaves on a stem.  Use medicine to help with itching as told by your doctor. Do not drive when you take this medicine.  Keep open sores dry, clean, and covered with a bandage and medicated cream, if needed.  Ask your doctor about medicine for children. GET HELP RIGHT AWAY IF:  You have open sores.  Redness spreads beyond the area of the rash.  There is yellowish white fluid (pus) coming from the rash.  Pain gets worse.  You have a temperature by mouth above 102 F (38.9 C), not  controlled by medicine. MAKE SURE YOU:  Understand these instructions.  Will watch your condition.  Will get help right away if you are not doing well or get worse. Document Released: 01/01/2011 Document Revised: 02/21/2012 Document Reviewed: 01/01/2011 [facility] Patient Information 2015 [place], [place]. This information is not intended to replace advice given to you by your health care provider. Make sure you discuss any questions you have with your health care provider.  Sexually Transmitted Disease A sexually transmitted disease (STD) is a disease or infection often passed to another person  during sex. However, STDs can be passed through nonsexual ways. An STD can be passed through:  Spit (saliva).  Semen.  Blood.  Mucus from the vagina.  Pee (urine). HOW CAN I LESSEN MY CHANCES OF GETTING AN STD?  Use:  Latex condoms.  Water-soluble lubricants with condoms. Do not use petroleum jelly or oils.  Dental dams. These are small pieces of latex that are used as a barrier during oral sex.  Avoid having more than one sex partner.  Do not have sex with someone who has other sex partners.  Do not have sex with anyone you do not know or who is at high risk for an STD.  Avoid risky sex that can break your skin.  Do not have sex if you have open sores on your mouth or skin.  Avoid drinking too much alcohol or taking illegal drugs. Alcohol and drugs can affect your good judgment.  Avoid oral and anal sex acts.  Get shots (vaccines) for HPV and hepatitis.  If you are at risk of being infected with HIV, it is advised that you take a certain medicine daily to prevent HIV infection. This is called pre-exposure prophylaxis (PrEP). You may be at risk if:  You are a man who has sex with other men (MSM).  You are attracted to the opposite sex (heterosexual) and are having sex with more than one partner.  You take drugs with a needle.  You have sex with someone who has HIV.  Talk with your doctor about if you are at high risk of being infected with HIV. If you begin to take PrEP, get tested for HIV first. Get tested every 3 months for as long as you are taking PrEP. WHAT SHOULD I DO IF I THINK I HAVE AN STD?  See your doctor.  Tell your sex partner(s) that you have an STD. They should be tested and treated.  Do not have sex until your doctor says it is okay. WHEN SHOULD I GET HELP? Get help right away if:  You have bad belly (abdominal) pain.  You are a man and have puffiness (swelling) or pain in your testicles.  You are a woman and have puffiness in your  vagina. Document Released: 01/06/2005 Document Revised: 12/04/2013 Document Reviewed: 05/25/2013 [facility] Patient Information 2015 [place], [place]. This information is not intended to replace advice given to you by your health care provider. Make sure you discuss any questions you have with your health care provider.

## 2014-05-29 NOTE — ED Provider Notes (Signed)
Greg Velasquez is a 23 y.o. male who presents to Urgent Care today for rash. Patient has multiple rashes. 1) left lower leg. Patient has a painful erythematous rash on his left lower leg. He notes several 8 years he has scratched. He notes it's oozing some pus. This is been present for several days. No fevers or chills nausea vomiting or diarrhea. No medications tried yet. 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 He notes itchiness across his bilateral upper extremities. This is been present for a few days as well. 3) burning with urination present for a few days. He recently had unprotected sex. He denies any penile discharge. Symptoms consistent with prior episodes of sexually-transmitted infection  No chest pains palpitations or shortness of breath.   Past Medical History  Diagnosis Date  . Chronic back pain greater than 3 months duration    History  Substance Use Topics  . Smoking status: Current Every Day Smoker -- 1.00 packs/day    Types: Cigarettes  . Smokeless tobacco: Not on file  . Alcohol Use: No   ROS as above Medications: No current facility-administered medications for this encounter.   Current Outpatient Prescriptions  Medication Sig Dispense Refill  . cephALEXin (KEFLEX) 500 MG capsule Take 1 capsule (500 mg total) by mouth 4 (four) times daily.  40 capsule  0  . diclofenac (VOLTAREN) 50 MG EC tablet Take 1 tablet (50 mg total) by mouth 2 (two) times daily as needed.  30 tablet  0  . doxycycline (VIBRAMYCIN) 100 MG capsule Take 1 capsule (100 mg total) by mouth 2 (two) times daily.  20 capsule  0  . PredniSONE 5 MG KIT 12 day dosepack po  1 kit  0    Exam:  BP 120/83  Pulse 106  Temp(Src) 97.9 F (36.6 C) (Oral)  Resp 24  SpO2 95% Gen: Well NAD HEENT: EOMI,  MMM Lungs: Normal work of breathing. CTABL Heart: RRR no MRG Abd: NABS, Soft. NT, ND Exts: Brisk capillary refill, warm and well perfused.  Skin: Left lower leg  erythematous with induration. Small areas of excoriation. No fluctuance palpated. Tender to touch. Bilateral upper extremities. Mild occasional lacy erythema. Genitals: No inguinal lymphadenopathy. Penis is normal-appearing with no lesions. Nontender. Testicles are descended bilaterally mildly tender.  No results found for this or any previous visit (from the past 24 hour(s)). No results found.  Assessment and Plan: 23 y.o. male with  1) left leg cellulitis versus folliculitis. Plan to treat with IM 1 g ceftriaxone now. Will use oral doxycycline and Keflex prompt followup if not rapidly improving. 2)  Poison oak : Possibly. Plan to treat with low-dose prednisone Dosepak  3) urethritis : Ceftriaxone and azithromycin. Urine cytology pending. Patient declined HIV and RPR.   Discussed warning signs or symptoms. Please see discharge instructions. Patient expresses understanding.    Greg Hams, MD 05/29/14 [id number]

## 2014-05-29 NOTE — ED Notes (Signed)
[place] poison [place] over.  Also c/o possible spider bite on lower left leg with redness and drainage.   No otc meds used.

## 2014-06-01 ENCOUNTER — Encounter (HOSPITAL_COMMUNITY): Payer: Self-pay | Admitting: Emergency Medicine

## 2014-06-01 ENCOUNTER — Emergency Department (HOSPITAL_COMMUNITY): Admission: EM | Admit: 2014-06-01 | Discharge: 2014-06-01 | Discharge status: Against Medical Advice | Payer: Self-pay

## 2014-06-01 ENCOUNTER — Inpatient Hospital Stay (HOSPITAL_COMMUNITY)
Admission: EM | Admit: 2014-06-01 | Discharge: 2014-06-03 | DRG: 603 | Disposition: A | Discharge status: Home / Self Care | Payer: Medicare Other | Attending: Family Medicine | Admitting: Family Medicine

## 2014-06-01 DIAGNOSIS — M549 Dorsalgia, unspecified: Secondary | ICD-10-CM | POA: Diagnosis present

## 2014-06-01 DIAGNOSIS — R21 Rash and other nonspecific skin eruption: Secondary | ICD-10-CM | POA: Diagnosis not present

## 2014-06-01 DIAGNOSIS — G8929 Other chronic pain: Secondary | ICD-10-CM | POA: Diagnosis present

## 2014-06-01 DIAGNOSIS — Z113 Encounter for screening for infections with a predominantly sexual mode of transmission: Secondary | ICD-10-CM | POA: Diagnosis not present

## 2014-06-01 DIAGNOSIS — F17200 Nicotine dependence, unspecified, uncomplicated: Secondary | ICD-10-CM | POA: Diagnosis present

## 2014-06-01 DIAGNOSIS — L255 Unspecified contact dermatitis due to plants, except food: Secondary | ICD-10-CM | POA: Diagnosis not present

## 2014-06-01 DIAGNOSIS — R300 Dysuria: Secondary | ICD-10-CM | POA: Diagnosis present

## 2014-06-01 DIAGNOSIS — L0390 Cellulitis, unspecified: Secondary | ICD-10-CM | POA: Diagnosis present

## 2014-06-01 DIAGNOSIS — L03116 Cellulitis of left lower limb: Secondary | ICD-10-CM | POA: Diagnosis present

## 2014-06-01 DIAGNOSIS — L02419 Cutaneous abscess of limb, unspecified: Secondary | ICD-10-CM | POA: Diagnosis not present

## 2014-06-01 DIAGNOSIS — L089 Local infection of the skin and subcutaneous tissue, unspecified: Secondary | ICD-10-CM | POA: Diagnosis present

## 2014-06-01 DIAGNOSIS — L03119 Cellulitis of unspecified part of limb: Secondary | ICD-10-CM

## 2014-06-01 DIAGNOSIS — L237 Allergic contact dermatitis due to plants, except food: Secondary | ICD-10-CM | POA: Diagnosis present

## 2014-06-01 LAB — URINALYSIS, ROUTINE W REFLEX MICROSCOPIC
GLUCOSE, UA: NEGATIVE mg/dL
HGB URINE DIPSTICK: NEGATIVE
Ketones, ur: NEGATIVE mg/dL
Leukocytes, UA: NEGATIVE
Nitrite: NEGATIVE
PH: 5.5 (ref 5.0–8.0)
Protein, ur: NEGATIVE mg/dL
SPECIFIC GRAVITY, URINE: 1.035 — AB (ref 1.005–1.030)
Urobilinogen, UA: 1 mg/dL (ref 0.0–1.0)

## 2014-06-01 LAB — CBC WITH DIFFERENTIAL/PLATELET
Basophils Absolute: 0 10*3/uL (ref 0.0–0.1)
Basophils Relative: 0 % (ref 0–1)
Eosinophils Absolute: 0.8 10*3/uL — ABNORMAL HIGH (ref 0.0–0.7)
Eosinophils Relative: 9 % — ABNORMAL HIGH (ref 0–5)
HCT: 44.5 % (ref 39.0–52.0)
Hemoglobin: 15.6 g/dL (ref 13.0–17.0)
LYMPHS ABS: 1.9 10*3/uL (ref 0.7–4.0)
Lymphocytes Relative: 22 % (ref 12–46)
MCH: 33.7 pg (ref 26.0–34.0)
MCHC: 35.1 g/dL (ref 30.0–36.0)
MCV: 96.1 fL (ref 78.0–100.0)
Monocytes Absolute: 0.7 10*3/uL (ref 0.1–1.0)
Monocytes Relative: 8 % (ref 3–12)
NEUTROS ABS: 5.5 10*3/uL (ref 1.7–7.7)
NEUTROS PCT: 61 % (ref 43–77)
PLATELETS: 250 10*3/uL (ref 150–400)
RBC: 4.63 MIL/uL (ref 4.22–5.81)
RDW: 13.1 % (ref 11.5–15.5)
WBC: 8.9 10*3/uL (ref 4.0–10.5)

## 2014-06-01 LAB — BASIC METABOLIC PANEL
BUN: 7 mg/dL (ref 6–23)
CHLORIDE: 103 meq/L (ref 96–112)
CO2: 27 meq/L (ref 19–32)
Calcium: 9.2 mg/dL (ref 8.4–10.5)
Creatinine, Ser: 0.89 mg/dL (ref 0.50–1.35)
GFR calc Af Amer: >90 mL/min (ref >90)
GFR calc non Af Amer: >90 mL/min (ref >90)
GLUCOSE: 100 mg/dL — AB (ref 70–99)
POTASSIUM: 3.9 meq/L (ref 3.7–5.3)
SODIUM: 141 meq/L (ref 137–147)

## 2014-06-01 MED ORDER — SODIUM CHLORIDE 0.9 % IJ SOLN
3.0000 mL | Freq: Two times a day (BID) | INTRAMUSCULAR | Status: DC
  Administered 2014-06-01 – 2014-06-03 (×3): 3 mL via INTRAVENOUS

## 2014-06-01 MED ORDER — ONDANSETRON HCL 4 MG/2ML IJ SOLN: 4.0000 mg | Freq: Four times a day (QID) | INTRAMUSCULAR | Status: DC | PRN

## 2014-06-01 MED ORDER — MORPHINE SULFATE 4 MG/ML IJ SOLN
4.0000 mg | Freq: Once | INTRAMUSCULAR | Status: AC
  Administered 2014-06-01: 4 mg via INTRAVENOUS
  Filled 2014-06-01: qty 1

## 2014-06-01 MED ORDER — HEPARIN SODIUM (PORCINE) 5000 UNIT/ML IJ SOLN
5000.0000 [IU] | Freq: Three times a day (TID) | INTRAMUSCULAR | Status: DC
  Administered 2014-06-01 – 2014-06-03 (×5): 5000 [IU] via SUBCUTANEOUS
  Filled 2014-06-01 (×9): qty 1

## 2014-06-01 MED ORDER — SODIUM CHLORIDE 0.9 % IJ SOLN: 3.0000 mL | INTRAMUSCULAR | Status: DC | PRN

## 2014-06-01 MED ORDER — PREDNISONE 50 MG PO TABS
60.0000 mg | ORAL_TABLET | Freq: Every day | ORAL | Status: DC
  Administered 2014-06-03: 60 mg via ORAL
  Filled 2014-06-01 (×3): qty 1

## 2014-06-01 MED ORDER — ACETAMINOPHEN 650 MG RE SUPP: 650.0000 mg | Freq: Four times a day (QID) | RECTAL | Status: DC | PRN

## 2014-06-01 MED ORDER — HYDROMORPHONE HCL PF 1 MG/ML IJ SOLN
0.5000 mg | INTRAMUSCULAR | Status: DC | PRN
  Administered 2014-06-01 – 2014-06-02 (×2): 0.5 mg via INTRAVENOUS
  Filled 2014-06-01 (×2): qty 1

## 2014-06-01 MED ORDER — SODIUM CHLORIDE 0.9 % IV BOLUS (SEPSIS)
1000.0000 mL | Freq: Once | INTRAVENOUS | Status: AC
  Administered 2014-06-01: 1000 mL via INTRAVENOUS

## 2014-06-01 MED ORDER — OXYCODONE-ACETAMINOPHEN 5-325 MG PO TABS
1.0000 | ORAL_TABLET | ORAL | Status: DC | PRN
  Administered 2014-06-01 – 2014-06-03 (×8): 2 via ORAL
  Filled 2014-06-01 (×8): qty 2

## 2014-06-01 MED ORDER — DIPHENHYDRAMINE-ZINC ACETATE 2-0.1 % EX CREA
TOPICAL_CREAM | Freq: Three times a day (TID) | CUTANEOUS | Status: DC | PRN
  Administered 2014-06-01: 1 via TOPICAL
  Filled 2014-06-01: qty 28

## 2014-06-01 MED ORDER — DIPHENHYDRAMINE HCL 50 MG/ML IJ SOLN
25.0000 mg | Freq: Four times a day (QID) | INTRAMUSCULAR | Status: DC | PRN
  Administered 2014-06-01: 23:00:00 via INTRAVENOUS
  Administered 2014-06-02 (×2): 25 mg via INTRAVENOUS
  Filled 2014-06-01 (×3): qty 1

## 2014-06-01 MED ORDER — SODIUM CHLORIDE 0.9 % IV SOLN: 250.0000 mL | INTRAVENOUS | Status: DC | PRN

## 2014-06-01 MED ORDER — ACETAMINOPHEN 325 MG PO TABS: 650.0000 mg | ORAL_TABLET | Freq: Four times a day (QID) | ORAL | Status: DC | PRN

## 2014-06-01 MED ORDER — VANCOMYCIN HCL IN DEXTROSE 1-5 GM/200ML-% IV SOLN
1000.0000 mg | Freq: Two times a day (BID) | INTRAVENOUS | Status: DC
  Administered 2014-06-01 – 2014-06-03 (×4): 1000 mg via INTRAVENOUS
  Filled 2014-06-01 (×6): qty 200

## 2014-06-01 MED ORDER — NICOTINE 21 MG/24HR TD PT24
21.0000 mg | MEDICATED_PATCH | Freq: Every day | TRANSDERMAL | Status: DC
  Administered 2014-06-01 – 2014-06-02 (×2): 21 mg via TRANSDERMAL
  Filled 2014-06-01 (×4): qty 1

## 2014-06-01 MED ORDER — ONDANSETRON HCL 4 MG PO TABS: 4.0000 mg | ORAL_TABLET | Freq: Four times a day (QID) | ORAL | Status: DC | PRN

## 2014-06-01 NOTE — Progress Notes (Signed)
ANTIBIOTIC CONSULT NOTE - INITIAL  Pharmacy Consult for vancomycin Indication: cellulitis  Allergies  Allergen Reactions  . Vicodin [Hydrocodone-Acetaminophen] Itching    Patient Measurements: Height: 6\' 1"  (185.4 cm) Weight: 260 lb (117.935 kg) IBW/kg (Calculated) : 79.9   Vital Signs: Temp: 98.4 F (36.9 C) (06/20 1703) Temp src: Oral (06/20 1703) BP: 116/61 mmHg (06/20 1745) Pulse Rate: 89 (06/20 1745) Intake/Output from previous day:   Intake/Output from this shift:    Labs:  Recent Labs  06/01/14 1752  WBC 8.9  HGB 15.6  PLT 250  CREATININE 0.89   Estimated Creatinine Clearance: 172.2 ml/min (by C-G formula based on Cr of 0.89). No results found for this basename: VANCOTROUGH, VANCOPEAK, VANCORANDOM, GENTTROUGH, GENTPEAK, GENTRANDOM, TOBRATROUGH, TOBRAPEAK, TOBRARND, AMIKACINPEAK, AMIKACINTROU, AMIKACIN,  in the last 72 hours   Microbiology: No results found for this or any previous visit (from the past 720 hour(s)).  Medical History: Past Medical History  Diagnosis Date  . Chronic back pain greater than 3 months duration     Medications:   see med rec   Assessment: Patient is a 23 y.o M presented to the ED with c/o redness in the scrotum/grion and abdominal/waist areas.  Patient was on doxycycline and keflex PTA.  To start vancomycin for cellulitis.  Goal of Therapy:  Vancomycin trough level 10-15 mcg/ml  Plan:  1) vacnomycin 1gm IV q12h 2) check level at steady state  Pham, Anh P 06/01/2014,6:59 PM

## 2014-06-01 NOTE — ED Notes (Signed)
[facility]y [facility]. He was given abx, steroids and pain medicine. He continues to have dysuria and redness on legs "is spreading and swollen and more painful."

## 2014-06-01 NOTE — ED Notes (Signed)
Noted redness to abd from waist down pt states has redness to groin into scrotum noted pinpoint rash along waistline to anterior abd pt states he works in [employer] and "pull ticks off all the time"  And pt bellieves he may have gotten bit by "something" to legs recently as well

## 2014-06-01 NOTE — ED Provider Notes (Signed)
CSN: [id number]     Arrival date & time 06/01/14  1657 History     Chief Complaint  Patient presents with  . Recurrent Skin Infections    The history is provided by the patient. No language interpreter was used.    HPI Comments: Greg Velasquez is a 23 y.o. male with a PMH of chronic back pain who presents to the Emergency Department complaining of recurrent skin infection. Patient states he has had a rash to his LE, UE and abdomen for the past week. He was seen at [facility] on 05/29/14 for this and was placed on prednisone dose pack, doxycycline, and keflex for the rash. Rash possibly due to poison ivy which the patient has had contact with at work. Patient has been taking these medications with worsening rash. The rash is pruritic and painful. Has also tried calamine lotion, Diclofenac and Benadryl with no relief. No fevers, chills, change in appetite/activity. Your girlfriend has a similar rash, which resolved. No hx of skin conditions in the past. No wheezing, difficulty breathing, dizziness, lightheadedness, or other concerns. Has also had an exposure to ticks.   Patient also complains of dysuria for the last 5 days. No penile discharge, testicular tenderness, scrotal edema/erythema. Patient had a new sexual partner. Was tested for gonorrhea and chlamydia which was negative. Patient given IM Rocephin and Azithromycin at the UC on 05/29/14. No abdominal pain, emesis, diarrhea, constipation.   Past Medical History  Diagnosis Date  . Chronic back pain greater than 3 months duration    Past Surgical History  Procedure Laterality Date  . Wrist fracture surgery     No family history on file. History  Substance Use Topics  . Smoking status: Current Every Day Smoker -- 1.00 packs/day    Types: Cigarettes  . Smokeless tobacco: Not on file  . Alcohol Use: No    Review of Systems  Constitutional: Negative for fever, chills, activity change, appetite change and fatigue.  Respiratory: Negative for  cough and shortness of breath.   Cardiovascular: Positive for leg swelling. Negative for chest pain.  Gastrointestinal: Negative for nausea, vomiting and abdominal pain.  Genitourinary: Positive for dysuria. Negative for urgency, hematuria, flank pain, decreased urine volume, penile swelling, scrotal swelling, difficulty urinating, penile pain and testicular pain.  Musculoskeletal: Negative for arthralgias, back pain, gait problem, joint swelling, myalgias and neck pain.  Skin: Positive for color change and rash.  Neurological: Negative for dizziness, weakness, light-headedness, numbness and headaches.    Allergies  Vicodin  Home Medications   Prior to Admission medications   Medication Sig Start Date End Date Taking? Authorizing Provider  cephALEXin (KEFLEX) 500 MG capsule Take 1 capsule (500 mg total) by mouth 4 (four) times daily. 05/29/14   [name], MD  diclofenac (VOLTAREN) 50 MG EC tablet Take 1 tablet (50 mg total) by mouth 2 (two) times daily as needed. 05/29/14   [name], MD  doxycycline (VIBRAMYCIN) 100 MG capsule Take 1 capsule (100 mg total) by mouth 2 (two) times daily. 05/29/14   [name], MD  PredniSONE 5 MG KIT 12 day dosepack po 05/29/14   [name], MD   BP 134/87  Pulse 110  Temp(Src) 98.4 F (36.9 C) (Oral)  Resp 16  Ht 6' 1"  (1.854 m)  Wt 260 lb (117.935 kg)  BMI 34.31 kg/m2  SpO2 96%  Filed Vitals:   06/01/14 1707 06/01/14 1709 06/01/14 1715 06/01/14 1745  BP: 134/87  125/67 116/61  Pulse:  110 100 89  Temp:      TempSrc:      Resp:      Height:      Weight:      SpO2:  96% 99% 98%    Physical Exam  Nursing note and vitals reviewed. Constitutional: He is oriented to person, place, and time. He appears well-developed and well-nourished. No distress.  Non-toxic  HENT:  Head: Normocephalic and atraumatic.  Right Ear: External ear normal.  Left Ear: External ear normal.  Mouth/Throat: Oropharynx is clear and moist.  No oral lesions or  sores  Eyes: Conjunctivae and EOM are normal. Pupils are equal, round, and reactive to light. Right eye exhibits no discharge. Left eye exhibits no discharge.  Neck: Normal range of motion. Neck supple.  Cardiovascular: Normal rate, regular rhythm and normal heart sounds.  Exam reveals no gallop and no friction rub.   No murmur heard. Pulmonary/Chest: Effort normal. No respiratory distress. He has no wheezes. He has no rales. He exhibits no tenderness.  Abdominal: Soft. He exhibits no distension and no mass. There is no tenderness. There is no rebound and no guarding.  Genitourinary:  No rash to the scrotum. No testicular edema/erythema. No penile lesions or discharge.   Musculoskeletal: Normal range of motion. He exhibits edema and tenderness.  ROM intact in the LE. Patient able to ambulate without difficulty or ataxia  Neurological: He is alert and oriented to person, place, and time.  Skin: Skin is warm and dry. Rash noted. He is not diaphoretic.  Diffuse convoluted macular-papular erythematous rash to the lower abdomen, which is blanchable. Patchy erythema to the bilateral LE from the ankles to the thighs which is warm to the touch. Closed excoriations to the LE bilaterally with no drainage. No blistering or mucosal involvement. No rash to palms or soles. No petechiae or purpura.   Psychiatric: He has a normal mood and affect. His behavior is normal.    ED Course  Procedures (including critical care time) COORDINATION OF CARE: 4:59 PM- Discussed treatment plan with patient at bedside and patient agreed to plan.   Labs Review Labs Reviewed - No data to display  Imaging Review No results found.   EKG Interpretation None      Results for orders placed during the hospital encounter of 06/01/14  CBC WITH DIFFERENTIAL      Result Value Ref Range   WBC 8.9  4.0 - 10.5 K/uL   RBC 4.63  4.22 - 5.81 MIL/uL   Hemoglobin 15.6  13.0 - 17.0 g/dL   HCT 44.5  39.0 - 52.0 %   MCV 96.1   78.0 - 100.0 fL   MCH 33.7  26.0 - 34.0 pg   MCHC 35.1  30.0 - 36.0 g/dL   RDW 13.1  11.5 - 15.5 %   Platelets 250  150 - 400 K/uL   Neutrophils Relative % 61  43 - 77 %   Neutro Abs 5.5  1.7 - 7.7 K/uL   Lymphocytes Relative 22  12 - 46 %   Lymphs Abs 1.9  0.7 - 4.0 K/uL   Monocytes Relative 8  3 - 12 %   Monocytes Absolute 0.7  0.1 - 1.0 K/uL   Eosinophils Relative 9 (*) 0 - 5 %   Eosinophils Absolute 0.8 (*) 0.0 - 0.7 K/uL   Basophils Relative 0  0 - 1 %   Basophils Absolute 0.0  0.0 - 0.1 K/uL  BASIC METABOLIC PANEL  Result Value Ref Range   Sodium 141  137 - 147 mEq/L   Potassium 3.9  3.7 - 5.3 mEq/L   Chloride 103  96 - 112 mEq/L   CO2 27  19 - 32 mEq/L   Glucose, Bld 100 (*) 70 - 99 mg/dL   BUN 7  6 - 23 mg/dL   Creatinine, Ser 0.89  0.50 - 1.35 mg/dL   Calcium 9.2  8.4 - 10.5 mg/dL   GFR calc non Af Amer >90  >90 mL/min   GFR calc Af Amer >90  >90 mL/min  URINALYSIS, ROUTINE W REFLEX MICROSCOPIC      Result Value Ref Range   Color, Urine AMBER (*) YELLOW   APPearance CLEAR  CLEAR   Specific Gravity, Urine 1.035 (*) 1.005 - 1.030   pH 5.5  5.0 - 8.0   Glucose, UA NEGATIVE  NEGATIVE mg/dL   Hgb urine dipstick NEGATIVE  NEGATIVE   Bilirubin Urine SMALL (*) NEGATIVE   Ketones, ur NEGATIVE  NEGATIVE mg/dL   Protein, ur NEGATIVE  NEGATIVE mg/dL   Urobilinogen, UA 1.0  0.0 - 1.0 mg/dL   Nitrite NEGATIVE  NEGATIVE   Leukocytes, UA NEGATIVE  NEGATIVE    MDM   Greg Velasquez is a 23 y.o. male with a PMH of chronic back pain who presents to the Emergency Department complaining of recurrent skin infection. Etiology of rash to abdomen possibly due to RMSF vs lymes disease vs cellulitis. Patient previously seen at [facility] for this and treated with Keflex, doxycycline and prednisone with worsening symptoms. Patient has failed OP therapy. Patient afebrile and non-toxic. No leukocytosis. Given patient's worsening symptoms will admit for IV antibiotics and further evaluation and  management. Patient given dose of Vanco in the ED.   Patient also complained of dysuria with unclear etiology. UA negative for UTI. Recently tested for gonorrhea and chlamydia which was negative. Patient also treated for this with Rocephin and Azithromycin on 05/29/14.      Final impressions: 1. Cellulitis of lower extremity, unspecified laterality   2. Cellulitis of left lower extremity   3. Poison ivy dermatitis   4. Rash       Mercy Moore PA-C   This patient was discussed with Dr. Dionicio Stall, PA-C 06/01/14 2030

## 2014-06-01 NOTE — ED Notes (Signed)
PA at bedside.

## 2014-06-01 NOTE — H&P (Signed)
[facility] Admission History and Physical Service Pager: [phone]  Patient name: Greg Velasquez Medical record number: [id number] Date of birth: 10/10/1991 Age: 23 y.o. Gender: male  Primary Care Provider: No PCP Per Patient Consultants: none Code Status: Full  Chief Complaint: lower extremity and abdominal rash and itching  Assessment and Plan: Greg Velasquez is a 23 y.o. male presenting with lower extremity cellulitis and presumed poison ivy dermatitis after failed outpatient therapy. PMH is significant for chronic back pain and numerous urgent care visits.  #Cellulitis - Apparently secondary to scratching pruritic rash presumed from poison ivy dermatitis - areas NOT marked due to extensiveness; not typical in appearance for tick-borne illness and history not entirely consistent with RMSF or Lyme disease, and exam otherwise not concerning for compartment syndrome or other limb-threatening emergency - s/p doxycycline as an outpt, with vancomycin started in the ED (6/20 is day 1) - continue vancomycin and monitor for improvement --> likely switch to clindamycin PO if improves on vanc - pain not controlled with morphine, so ordered for Dilaudid 0.5 mg IV q3 PRN; will want to change to PO when tolerating - see also immediately below  #Poison ivy dermatitis - presumed based on history, exam (linear excoriations ?reflecting healing blisters), and previous diagnosis at urgent care - s/p prednisone (low-dose) x3 days but unclear exactly how pt has been taking this, no improvement with this or antihistamines / calamine lotion - increase prednisone to 60 mg for 1 week with plan to taper to 40 mg for 1 week,t hen 20 mg for 1 week, then off - ordered for Benadryl 25 mg IV q6 PRN for itching given no relief with topical and PO for pruritic rash especially while treating with Dilaudid, but will want to change to PO antihistamine as needed as soon as able  FEN/GI: regular  diet, saline lock IV Prophylaxis: subQ heparin  Disposition: admit to inpatient with management as above, med-surg bed, attending Dr. Nori Riis  History of Present Illness: Greg Velasquez is a 24 y.o. male presenting with extensive painful rash on his bilateral lower extremities and abdomen; he was seen at urgent care on 6/17 and was diagnosed with cellulitis and poison ivy exposure, and has been taking prednisone and doxycycline without relief (of note, pt was also treated with azithromycin and Rocephin for questionable exposure to STI / urinary symptoms, of which he does not complain to this Probation officer). He complains that his left lower leg especially is redder, more painful, and itchier despite his medications and Benadryl with calamine lotion. He has excoriations on his legs from scratching and states that one place on his right leg was "oozy" at one point, but has not continued to have drainage. He denies frank fevers, chills, abdominal pain, N/V/D.   Pt does state that he was working outside "tearing down a fence about a week ago" and thinks that may be where he got into the poison ivy, and also relates that he works outside often and pulls ticks off of himself frequently. He denies frank joint aches / body aches, fever, headache / malaise.  Review Of Systems: Per HPI. Otherwise 12 point review of systems was performed and was unremarkable.  Patient Active Problem List   Diagnosis Date Noted  . OBESITY, NOS 02/09/2007  . ATTENTION DEFICIT, W/HYPERACTIVITY 02/09/2007   Past Medical History: Past Medical History  Diagnosis Date  . Chronic back pain greater than 3 months duration    Past Surgical History: Past  Surgical History  Procedure Laterality Date  . Wrist fracture surgery     Social History: History  Substance Use Topics  . Smoking status: Current Every Day Smoker -- 1.00 packs/day    Types: Cigarettes  . Smokeless tobacco: Not on file  . Alcohol Use: No   Please also refer to  relevant sections of EMR.  Family History: History reviewed. No pertinent family history. Allergies and Medications: Allergies  Allergen Reactions  . Vicodin [Hydrocodone-Acetaminophen] Itching   No current facility-administered medications on file prior to encounter.   Current Outpatient Prescriptions on File Prior to Encounter  Medication Sig Dispense Refill  . cephALEXin (KEFLEX) 500 MG capsule Take 1 capsule (500 mg total) by mouth 4 (four) times daily.  40 capsule  0  . diclofenac (VOLTAREN) 50 MG EC tablet Take 1 tablet (50 mg total) by mouth 2 (two) times daily as needed.  30 tablet  0  . doxycycline (VIBRAMYCIN) 100 MG capsule Take 1 capsule (100 mg total) by mouth 2 (two) times daily.  20 capsule  0  . PredniSONE 5 MG KIT 12 day dosepack po  1 kit  0    Objective: BP 116/61  Pulse 89  Temp(Src) 98.4 F (36.9 C) (Oral)  Resp 16  Ht 6' 1"  (1.854 m)  Wt 260 lb (117.935 kg)  BMI 34.31 kg/m2  SpO2 98% Exam: General: young white male lying in bed, in NAD HEENT: [place]/AT, EOMI, PERRLA, MM slightly dry Cardiovascular: RRR, no murmur appreciated Respiratory: CTAB, no wheezes Abdomen: soft, nontender, diffuse rash (see below), BS+ Extremities: diffuse rash (see below); warm-well-perfused otherwise Skin: diffuse, confluent maculopapular rash to bilateral lower extremities, worst in left lower leg  Scattered, eschar-covered excoriations to bilateral shins, right worse than left  Rash fades to distinct raised papules on upper left leg  Rash with underlying erythema worse on left lower leg, lighter on right leg and abdomen  Some extension into upper groin from abdomen  No areas of frank bleeding or drainage Neuro: alert / oriented, no gross deficits, moves all extremities equally  Labs and Imaging: CBC BMET   Recent Labs Lab 06/01/14 1752  WBC 8.9  HGB 15.6  HCT 44.5  PLT 250    Recent Labs Lab 06/01/14 1752  NA 141  K 3.9  CL 103  CO2 27  BUN 7  CREATININE 0.89   GLUCOSE 100*  CALCIUM 9.2     UA: Amber, increased specific gravity, small bilirubin  Emmaline Kluver, MD 06/01/2014, 8:07 PM PGY-2, [facility] pager: [phone], text pages welcome

## 2014-06-02 DIAGNOSIS — R21 Rash and other nonspecific skin eruption: Secondary | ICD-10-CM

## 2014-06-02 DIAGNOSIS — L255 Unspecified contact dermatitis due to plants, except food: Secondary | ICD-10-CM

## 2014-06-02 LAB — BASIC METABOLIC PANEL
BUN: 8 mg/dL (ref 6–23)
CALCIUM: 9 mg/dL (ref 8.4–10.5)
CO2: 26 mEq/L (ref 19–32)
CREATININE: 0.91 mg/dL (ref 0.50–1.35)
Chloride: 103 mEq/L (ref 96–112)
GFR calc Af Amer: >90 mL/min (ref >90)
GLUCOSE: 91 mg/dL (ref 70–99)
Potassium: 3.8 mEq/L (ref 3.7–5.3)
SODIUM: 142 meq/L (ref 137–147)

## 2014-06-02 LAB — CBC
HCT: 42.2 % (ref 39.0–52.0)
Hemoglobin: 14.4 g/dL (ref 13.0–17.0)
MCH: 32.7 pg (ref 26.0–34.0)
MCHC: 34.1 g/dL (ref 30.0–36.0)
MCV: 95.9 fL (ref 78.0–100.0)
PLATELETS: 247 10*3/uL (ref 150–400)
RBC: 4.4 MIL/uL (ref 4.22–5.81)
RDW: 12.9 % (ref 11.5–15.5)
WBC: 8.4 10*3/uL (ref 4.0–10.5)

## 2014-06-02 MED ORDER — HYDROXYZINE HCL 25 MG PO TABS
25.0000 mg | ORAL_TABLET | Freq: Three times a day (TID) | ORAL | Status: DC | PRN
  Administered 2014-06-02 (×3): 25 mg via ORAL
  Filled 2014-06-02 (×3): qty 1

## 2014-06-02 MED ORDER — OXYCODONE HCL 5 MG PO TABS
5.0000 mg | ORAL_TABLET | ORAL | Status: DC | PRN
  Administered 2014-06-02 – 2014-06-03 (×3): 5 mg via ORAL
  Filled 2014-06-02 (×3): qty 1

## 2014-06-02 NOTE — ED Provider Notes (Signed)
Medical screening examination/treatment/procedure(s) were conducted as a shared visit with non-physician practitioner(s) and myself.  I personally evaluated the patient during the encounter.  1 week of rash and worsening redness to bilateral LE.  Questionable exposure to poison oak, multiple tick bites.  No fever or joint aches. Diffuse patchy erythema to Bilateral LE , L>R with multiple scabbed excoriations. Rash fades to maculopapular appearance on thighs and lower abdomen. Cellulitis, with possible RMSF.  Nontoxic appearing. NV intact.    EKG Interpretation None       Glynn OctaveStephen Rancour, MD 06/02/14 [phone]

## 2014-06-02 NOTE — Discharge Summary (Signed)
Family Medicine Teaching Service Hospital Discharge Summary  [id number] Date of birth: 02-06-1991 Age: 23 y.o. Gender: male Date of Admission: 06/01/2014  Date of Discharge: 06/03/14 Admitting Physician: Nestor RampSara L Neal, MD  Primary Care Provider: No PCP Per Patient Consultants: None  Indication for Hospitalization: Lower extremity swelling and pain with extensive rash  Discharge Diagnoses/Problem List:  Cellulitis, Right lower extremity, likely secondary to superinfection from Poison Ivy Dermatitis - Improved Drug-induced eruption, generalized, suspected secondary to Doxycycline - Improved Poison Ivy Dermatitis, bilateral lower extremities - Improved  Disposition: Home  Discharge Condition: Stable  Discharge Exam: General: sitting up in bed Cardiovascular: RRR, no murmur appreciated  Respiratory: CTAB, no wheezes  Abdomen: soft, nontender, improved diffuse erythematous papular rash (see below), BS+  Extremities: Reduced b/l LE tenderness, negative Homan's sign, diffuse rash bilateral lower legs R>L (see below) Skin: Improved diffuse, confluent maculopapular rash to bilateral lower extremities (R>L) Scattered, eschar-covered excoriations to bilateral shins, right worse than left  Rash fades to distinct raised papules on upper right leg  Neuro: alert / oriented, no gross deficits, moves all extremities equally  Brief Hospital Course:  Greg CoMichael S Velasquez is a 23 y.o. male who presented with lower extremity cellulitis and presumed poison ivy dermatitis after failed outpatient therapy. PMH is significant for chronic back pain. History of outdoor exposure to foliage at work 1 week prior to presentation, developed bilateral LE rash / blisters, presented to Urgent Care on 05/29/14 and diagnosed with poison ivy dermatitis, treated with prednisone dosepak, Ceftriaxone 1g IM, PO Doxycycline + Keflex PO. Experienced worsening b/l LE pain, swelling, and  developed new papular rash with generalized spread to legs, arms, and abdomen. In ED, vitals stable, afebrile, WBC 8.9, concern for RLE cellulitis superinfection d/t scratching from poison ivy dermatitis. Admitted for IV antibiotics with Vancomycin, IV pain control, anti-histamines, and monitoring. Also collected RMSF titers, B.Burgdorfi ab for unlikely tick-borne illness.  During hospitalization, patient demonstrated gradual improvement in lower ext pain and itching. Appearance of diffuse papular rash across legs, arms, and abdomen, suspected to be from drug eruption from Doxycycline (appropriate with onset of rash). Considered acute DVT in RLE, however unlikely with low Well's Score 1, exam inconsistent, pt active. Transitioned to Clindamycin PO, Percocet, Prednisone, and Hydroxyzine prior to discharge, remained afebrile, WBC stable, tolerated PO and ambulation, recommended close follow-up within 1 week.   Issues for Follow Up:  1. Cellulitis f/u (b/l LE, R>L) - Clindamycin 600mg  TID (total 14 day course abx, last day 06/14/14)  2. Poison Ivy - Complete Prednisone taper 40mg  >> 20mg  >> 10mg  over 14 days  Significant Procedures: none  Significant Labs and Imaging:   Recent Labs Lab 06/01/14 1752 06/02/14 0410  WBC 8.9 8.4  HGB 15.6 14.4  HCT 44.5 42.2  PLT 250 247    Recent Labs Lab 06/01/14 1752 06/02/14 0410  NA 141 142  K 3.9 3.8  CL 103 103  CO2 27 26  GLUCOSE 100* 91  BUN 7 8  CREATININE 0.89 0.91  CALCIUM 9.2 9.0   RMSF ab, IgM- pending  RMSF ab, IgG - 0.92 (elevated, equivocal range - likely prior exposure) B. Burgdorfi ab - negative  Results/Tests Pending at Time of Discharge:  RMSF ab, IgM- pending   Discharge Medications:    Medication List    STOP taking these medications       cephALEXin 500 MG capsule  Commonly known as:  KEFLEX  doxycycline 100 MG capsule  Commonly known as:  VIBRAMYCIN      TAKE these medications       CALAMINE EX  Apply 1  application topically once.     clindamycin 300 MG capsule  Commonly known as:  CLEOCIN  Take 2 capsules (600 mg total) by mouth 3 (three) times daily.     diclofenac 50 MG EC tablet  Commonly known as:  VOLTAREN  Take 1 tablet (50 mg total) by mouth 2 (two) times daily as needed.     hydrOXYzine 25 MG tablet  Commonly known as:  ATARAX/VISTARIL  Take 1-2 tablets (25-50 mg total) by mouth 3 (three) times daily as needed for itching.     oxyCODONE-acetaminophen 5-325 MG per tablet  Commonly known as:  PERCOCET/ROXICET  Take 1-2 tablets by mouth every 6 (six) hours as needed for severe pain.     predniSONE 10 MG tablet  Commonly known as:  DELTASONE  6/23 to 6/25 take 4 tabs (40mg ) daily with breakfast, 6/26 to 6/29 take 2 tabs (20mg ), 6/30 to 7/3 take 1 tab (10mg )  Start taking on:  06/04/2014        Discharge Instructions: Please refer to Patient Instructions section of EMR for full details.  Patient was counseled important signs and symptoms that should prompt return to medical care, changes in medications, dietary instructions, activity restrictions, and follow up appointments.   Follow-Up Appointments: Follow-up Information   Follow up with [facility] In 1 week. (Follow-up improvement with infection and rash)    Contact information:   [address] [place] [id number]       Saralyn PilarAlexander Karamalegos, DO 06/03/2014, 7:57 PM PGY-1, [facility]

## 2014-06-02 NOTE — H&P (Signed)
Call Pager [phone] for any questions or notifications regarding this patient  FMTS Attending Admission Note: Greg Haste MD Attending pager:[phone] I  have seen and examined this patient, reviewed their chart. I have discussed this patient with the resident. I agree with the resident's findings, assessment and care plan. 

## 2014-06-02 NOTE — Progress Notes (Signed)
CALL PAGER [phone] for any questions or notifications regarding this patient   FMTS Attending Daily Note: Greg LevySara Neal MD  Attending [phone]  office [phone]  I  have seen and examined this patient, reviewed their chart. I have discussed this patient with the resident. I agree with the resident's findings, assessment and care plan.Rash appears to me to be more consistent with drug eruption (suspect from doxycicline) I discussed with him. I will continue antibiotics and prednisone for now---doubt that he will need IV antibiotics long.

## 2014-06-02 NOTE — Progress Notes (Signed)
Family Medicine Teaching Service Daily Progress Note Intern Pager: [phone]  Patient name: Greg Velasquez Medical record number: [id number] Date of birth: 1990-12-14 Age: 23 y.o. Gender: male  Primary Care Clarene Curran: No PCP Per Patient Consultants: none Code Status: Full  Pt Overview and Major Events to Date:  6/20 - Admitted for RLE cellulitis with extending rash / pruritis, concern for poison ivy, Vancomycin (6/20>>) 6/21 - Cont Vanc, Prednisone, rash stable / minimal improvement  Assessment and Plan: Greg Velasquez is a 23 y.o. male presenting with lower extremity cellulitis and presumed poison ivy dermatitis after failed outpatient therapy. PMH is significant for chronic back pain and numerous urgent care visits.   # Cellulitis, Right lower ext - Stable / Minimal improvement - Suspected secondary to scratching pruritic rash presumed from poison ivy dermatitis  - areas NOT marked on admission due to extensiveness; not typical in appearance for tick-borne illness and history not entirely consistent with RMSF or Lyme disease, and exam otherwise not concerning for compartment syndrome or other limb-threatening emergency - Currently afebrile, no significant change, somewhat symptomatically improved, rash persistent - WBC stable 8.4 - ordered RMSF and B.Burgdorfi titers - s/p doxycycline (6/17>>6/20) as an outpt for cellulitis coverage, however consider resume for possible tick-borne - Continue Vancomycin IV (6/20>>) - Pain control: Percocet 1-2 tabs q 4 hr PRN / Oxy IR 5mg  q 2 hr PRN breakthrough (DC'd Dilaudid received 1mg  o/n)  # Presumed Poison ivy dermatitis, b/l LE and abdomen - Based on history, exam (linear excoriations, healing blisters), and previous diagnosis at urgent care (05/29/14) - s/p prednisone (low-dose) x3 days, no improvement with this or antihistamines / calamine lotion  - Continue Prednisone to 60 mg for 1 week, taper weekly 40mg  >> 20mg  >> off - Benadryl 25mg  IV q 6  PRN itching (unclear if significant relief from Benadryl IV, will wean off today if not improved) - Add Hydroxyzine 25mg  TID PRN itching - topical benadryl cream PRN - Cold compresses  FEN/GI: regular diet, SLIV  Prophylaxis: subQ heparin  Disposition: admit to inpatient with management as above, IV antibiotics, IV benadryl, anti-histamines, pain control, continue to monitor for clinical improvement.  Subjective:  Feels similar today, denies feeling any worse. Still persistent bilateral lower leg (R>L) pain and itching (legs, arms, abdomen). Pain improved with Percocet, minimal relief from Dilaudid IV, some itching relief, trying hard not to scratch. Eager to leave hospital room, requested to have permission to leave unit in wheelchair. Denies fevers/chills, HA, joint pain, n/v.  Objective: Temp:  [97.6 F (36.4 C)-98.8 F (37.1 C)] 97.6 F (36.4 C) (06/21 0556) Pulse Rate:  [72-110] 72 (06/21 0556) Resp:  [16-20] 20 (06/21 0556) BP: (116-143)/(61-87) 121/75 mmHg (06/21 0556) SpO2:  [94 %-100 %] 100 % (06/21 0556) Weight:  [247 lb 6.4 oz (112.22 kg)-260 lb (117.935 kg)] 247 lb 6.4 oz (112.22 kg) (06/20 2117) Physical Exam: General: resting in bed, NAD HEENT: mild dry MM Cardiovascular: RRR, no murmur appreciated  Respiratory: CTAB, no wheezes  Abdomen: soft, nontender, diffuse erythematous papular rash (see below), BS+  Extremities: diffuse rash bilateral lower legs R>L(see below); warm-well-perfused otherwise  Skin: stable to mildly improved diffuse, confluent maculopapular rash to bilateral lower extremities, worst in LLE  Scattered, eschar-covered excoriations to bilateral shins, right worse than left  Rash fades to distinct raised papules on upper left leg  Rash with underlying erythema worse on left lower leg, lighter on right leg and abdomen  Some extension into upper groin from  abdomen  No areas of frank bleeding or drainage  Neuro: alert / oriented, no gross deficits,  moves all extremities equally  Laboratory:  Recent Labs Lab 06/01/14 1752 06/02/14 0410  WBC 8.9 8.4  HGB 15.6 14.4  HCT 44.5 42.2  PLT 250 247    Recent Labs Lab 06/01/14 1752 06/02/14 0410  NA 141 142  K 3.9 3.8  CL 103 103  CO2 27 26  BUN 7 8  CREATININE 0.89 0.91  CALCIUM 9.2 9.0  GLUCOSE 100* 91   Urinalysis    Component Value Date/Time   COLORURINE AMBER* 06/01/2014 1755   APPEARANCEUR CLEAR 06/01/2014 1755   LABSPEC 1.035* 06/01/2014 1755   PHURINE 5.5 06/01/2014 1755   GLUCOSEU NEGATIVE 06/01/2014 1755   HGBUR NEGATIVE 06/01/2014 1755   BILIRUBINUR SMALL* 06/01/2014 1755   KETONESUR NEGATIVE 06/01/2014 1755   PROTEINUR NEGATIVE 06/01/2014 1755   UROBILINOGEN 1.0 06/01/2014 1755   NITRITE NEGATIVE 06/01/2014 1755   LEUKOCYTESUR NEGATIVE 06/01/2014 1755   RMSF ab, IgM / IgG - pending B. Burgdorfi ab - pending  Imaging/Diagnostic Tests:  none  Saralyn PilarAlexander Karamalegos, DO 06/02/2014, 9:15 AM PGY-1, [facility] FPTS Intern pager: [phone], text pages welcome

## 2014-06-03 DIAGNOSIS — L03119 Cellulitis of unspecified part of limb: Principal | ICD-10-CM

## 2014-06-03 DIAGNOSIS — L02419 Cutaneous abscess of limb, unspecified: Principal | ICD-10-CM

## 2014-06-03 LAB — B. BURGDORFI ANTIBODIES: B burgdorferi Ab IgG+IgM: 0.33 {ISR}

## 2014-06-03 MED ORDER — CLINDAMYCIN HCL 300 MG PO CAPS
600.0000 mg | ORAL_CAPSULE | Freq: Three times a day (TID) | ORAL | Status: DC
  Administered 2014-06-03: 600 mg via ORAL
  Filled 2014-06-03 (×3): qty 2

## 2014-06-03 MED ORDER — HYDROXYZINE HCL 25 MG PO TABS: 25.0000 mg | ORAL_TABLET | Freq: Three times a day (TID) | ORAL | Status: DC | PRN

## 2014-06-03 MED ORDER — CLINDAMYCIN HCL 300 MG PO CAPS
300.0000 mg | ORAL_CAPSULE | Freq: Four times a day (QID) | ORAL | Status: DC
  Filled 2014-06-03 (×4): qty 1

## 2014-06-03 MED ORDER — PREDNISONE 10 MG PO TABS: ORAL_TABLET | ORAL | Status: AC

## 2014-06-03 MED ORDER — OXYCODONE-ACETAMINOPHEN 5-325 MG PO TABS: 1.0000 | ORAL_TABLET | Freq: Four times a day (QID) | ORAL | Status: DC | PRN

## 2014-06-03 MED ORDER — CLINDAMYCIN HCL 300 MG PO CAPS: 600.0000 mg | ORAL_CAPSULE | Freq: Three times a day (TID) | ORAL | Status: DC

## 2014-06-03 NOTE — Discharge Instructions (Signed)
We believe that you initially got poison ivy, which led to a skin infection, and then you developed a drug reaction to Doxycycline antibiotic.  - Please take Clindamycin antibiotic as prescribed (last dose on 06/14/14) - Complete Prednisone course as prescribed (last dose on 06/14/14) - May use Percocet for pain and Hydroxyzine for itching - [facility] follow-up within 1 week to make sure that rash / infection are improving. If you develop any significant worsening of rash, pain, or swelling, shortness of breath or chest pain. Please return to Urgent Care or Emergency Department  Cellulitis Cellulitis is an infection of the skin and the tissue beneath it. The infected area is usually red and tender. Cellulitis occurs most often in the arms and lower legs.  CAUSES  Cellulitis is caused by bacteria that enter the skin through cracks or cuts in the skin. The most common types of bacteria that cause cellulitis are Staphylococcus and Streptococcus. SYMPTOMS   Redness and warmth.  Swelling.  Tenderness or pain.  Fever. DIAGNOSIS  Your caregiver can usually determine what is wrong based on a physical exam. Blood tests may also be done. TREATMENT  Treatment usually involves taking an antibiotic medicine. HOME CARE INSTRUCTIONS   Take your antibiotics as directed. Finish them even if you start to feel better.  Keep the infected arm or leg elevated to reduce swelling.  Apply a warm cloth to the affected area up to 4 times per day to relieve pain.  Only take over-the-counter or prescription medicines for pain, discomfort, or fever as directed by your caregiver.  Keep all follow-up appointments as directed by your caregiver. SEEK MEDICAL CARE IF:   You notice red streaks coming from the infected area.  Your red area gets larger or turns dark in color.  Your bone or joint underneath the infected area becomes painful after the skin has healed.  Your  infection returns in the same area or another area.  You notice a swollen bump in the infected area.  You develop new symptoms. SEEK IMMEDIATE MEDICAL CARE IF:   You have a fever.  You feel very sleepy.  You develop vomiting or diarrhea.  You have a general ill feeling (malaise) with muscle aches and pains. MAKE SURE YOU:   Understand these instructions.  Will watch your condition.  Will get help right away if you are not doing well or get worse. Document Released: 09/08/2005 Document Revised: 05/30/2012 Document Reviewed: 02/14/2012 [facility] Patient Information 2015 [place] [place]. This information is not intended to replace advice given to you by your health care provider. Make sure you discuss any questions you have with your health care provider.

## 2014-06-03 NOTE — Progress Notes (Signed)
Family Medicine Teaching Service Daily Progress Note Intern Pager: [phone]  Patient name: Greg Velasquez Medical record number: [id number] Date of birth: 1991/01/07 Age: 23 y.o. Gender: male  Primary Care Provider: No PCP Per Patient Consultants: none Code Status: Full  Pt Overview and Major Events to Date:  6/20 - Admitted for RLE cellulitis with extending rash / pruritis, concern for poison ivy, Vancomycin (6/20>>) 6/21 - Cont Vanc, Prednisone, rash stable / minimal improvement  Assessment and Plan: Greg Velasquez is a 23 y.o. male presenting with lower extremity cellulitis and presumed poison ivy dermatitis after failed outpatient therapy. PMH is significant for chronic back pain and numerous urgent care visits.   # Cellulitis, Right lower ext - Improved - Suspected secondary to scratching pruritic rash presumed from poison ivy dermatitis  - areas NOT marked on admission due to extensiveness; not typical in appearance for tick-borne illness and history not entirely consistent with RMSF or Lyme disease, and exam otherwise not concerning for compartment syndrome or other limb-threatening emergency - Currently afebrile, no significant change, somewhat symptomatically improved, rash persistent - WBC stable 8.4 - ordered RMSF and B.Burgdorfi titers (pending) - s/p doxycycline (6/17>>6/20) as an outpt for cellulitis coverage, suspected etiology for rash with drug eruption - Start Clindamycin 600mg  PO TID x total 14 day course (last day 06/14/14) - Discontinue Vancomycin IV (6/20>>6/22) - Pain control: Percocet 1-2 tabs q 4 hr PRN / Oxy IR 5mg  q 2 hr PRN breakthrough - total (total 20mg  Oxy in 24 hr) - Wells for DVT 1, low risk group  # Rash: generalized fine papular (suspected drug reaction-Doxy?) and linear blisters (presumed poison ivy) - Based on history, exam suspected 2 different rashes, initially with presumed poison ivy due to known exposure with work, followed by scratching and  superinfection with cellulitis, then [facility], with generalized erythematous papular rash consistent with likely drug reaction/eruption rash - Continue Prednisone to 60mg  >> 40 >> 20 taper x total 14 days - Hydroxyzine 25-50mg  TID PRN itching (DC'd Benadryl IV) - topical benadryl cream PRN - Cold compresses  FEN/GI: regular diet, SLIV  Prophylaxis: subQ heparin  Disposition: admit to inpatient with management as above, IV antibiotics, IV benadryl, anti-histamines, pain control, continue to monitor for clinical improvement. Discharge later today if tolerating PO antibiotics.  Subjective:  Feels better today. Reduced LE swelling and pain. Similar rash but less red. Improved itching. Ready to go home.  Objective: Temp:  [97.5 F (36.4 C)-98.3 F (36.8 C)] 97.5 F (36.4 C) (06/22 14780638) Pulse Rate:  [70-74] 71 (06/22 0638) Resp:  [18] 18 (06/22 29560638) BP: (92-123)/(59-70) 92/60 mmHg (06/22 0638) SpO2:  [97 %-100 %] 98 % (06/22 21300638) Physical Exam: General: resting in bed, NAD HEENT: mild dry MM Cardiovascular: RRR, no murmur appreciated  Respiratory: CTAB, no wheezes  Abdomen: soft, nontender, diffuse erythematous papular rash (see below), BS+  Extremities: Reduced b/l LE tenderness, negative Homan's sign, diffuse rash bilateral lower legs R>L(see below); warm-well-perfused otherwise  Skin: Improved diffuse, confluent maculopapular rash to bilateral lower extremities, worst in LLE  Scattered, eschar-covered excoriations to bilateral shins, right worse than left  Rash fades to distinct raised papules on upper left leg  Rash with underlying erythema worse on left lower leg, lighter on right leg and abdomen  Some extension into upper groin from abdomen  No areas of frank bleeding or drainage  Neuro: alert / oriented, no gross deficits, moves all extremities equally  Laboratory:  Recent Labs  Lab 06/01/14 1752 06/02/14 0410  WBC 8.9 8.4  HGB 15.6 14.4  HCT  44.5 42.2  PLT 250 247    Recent Labs Lab 06/01/14 1752 06/02/14 0410  NA 141 142  K 3.9 3.8  CL 103 103  CO2 27 26  BUN 7 8  CREATININE 0.89 0.91  CALCIUM 9.2 9.0  GLUCOSE 100* 91   Urinalysis    Component Value Date/Time   COLORURINE AMBER* 06/01/2014 1755   APPEARANCEUR CLEAR 06/01/2014 1755   LABSPEC 1.035* 06/01/2014 1755   PHURINE 5.5 06/01/2014 1755   GLUCOSEU NEGATIVE 06/01/2014 1755   HGBUR NEGATIVE 06/01/2014 1755   BILIRUBINUR SMALL* 06/01/2014 1755   KETONESUR NEGATIVE 06/01/2014 1755   PROTEINUR NEGATIVE 06/01/2014 1755   UROBILINOGEN 1.0 06/01/2014 1755   NITRITE NEGATIVE 06/01/2014 1755   LEUKOCYTESUR NEGATIVE 06/01/2014 1755   RMSF ab, IgM / IgG - pending B. Burgdorfi ab - pending  Imaging/Diagnostic Tests:  none  Saralyn PilarAlexander Karamalegos, DO 06/03/2014, 8:38 AM PGY-1, [facility] FPTS Intern pager: [phone], text pages welcome

## 2014-06-03 NOTE — Progress Notes (Signed)
FMTS ATTENDING  NOTE Greg Eniola,MD I  have seen and examined this patient, reviewed their chart. I have discussed this patient with the resident. Patient endorsed improvement of his symptoms, still having some pain of his right LL which is generalized. On exam RLL has circumferential erythema which according to patient and resident has improved a lot. ( Just seeing patient for the first time today). Plan to assess his wells criteria for DVT and if needed to R/O DVT although unlikely due to his history of exposure to poison Ivy. Monitor for improvement, plan to switch him to oral antibiotic today. Skin rash likely due to doxy improving as well.

## 2014-06-03 NOTE — Progress Notes (Signed)
Patient d/c to home, IV removed per patient earlier in shift.  Prescriptions given, instructions reviewed.  Information regarding cellulitis education reviewed with patient.

## 2014-06-04 NOTE — Discharge Summary (Signed)
FMTS ATTENDING  NOTE Greg Manzano,MD I  have seen and examined this patient, reviewed their chart. I have discussed this patient with the resident. I agree with the resident's findings, assessment and care plan. 

## 2014-06-05 LAB — ROCKY MTN SPOTTED FVR AB, IGG-BLOOD: RMSF IGG: 0.92 IV — AB

## 2014-06-05 LAB — ROCKY MTN SPOTTED FVR AB, IGM-BLOOD: RMSF IGM: 0.18 IV (ref 0.00–0.89)

## 2014-06-22 ENCOUNTER — Encounter (HOSPITAL_COMMUNITY): Payer: Self-pay | Admitting: Emergency Medicine

## 2014-06-22 ENCOUNTER — Emergency Department (HOSPITAL_COMMUNITY)
Admission: EM | Admit: 2014-06-22 | Discharge: 2014-06-23 | Disposition: A | Discharge status: Home / Self Care | Payer: Medicare Other | Attending: Emergency Medicine | Admitting: Emergency Medicine

## 2014-06-22 DIAGNOSIS — G8929 Other chronic pain: Secondary | ICD-10-CM | POA: Insufficient documentation

## 2014-06-22 DIAGNOSIS — Z23 Encounter for immunization: Secondary | ICD-10-CM | POA: Insufficient documentation

## 2014-06-22 DIAGNOSIS — R609 Edema, unspecified: Secondary | ICD-10-CM | POA: Insufficient documentation

## 2014-06-22 DIAGNOSIS — M79604 Pain in right leg: Secondary | ICD-10-CM

## 2014-06-22 DIAGNOSIS — M79609 Pain in unspecified limb: Secondary | ICD-10-CM | POA: Insufficient documentation

## 2014-06-22 DIAGNOSIS — F17200 Nicotine dependence, unspecified, uncomplicated: Secondary | ICD-10-CM | POA: Diagnosis not present

## 2014-06-22 DIAGNOSIS — R600 Localized edema: Secondary | ICD-10-CM

## 2014-06-22 LAB — PROTIME-INR
INR: 1.12 (ref 0.00–1.49)
PROTHROMBIN TIME: 14.4 s (ref 11.6–15.2)

## 2014-06-22 LAB — CBC WITH DIFFERENTIAL/PLATELET
BASOS ABS: 0 10*3/uL (ref 0.0–0.1)
Basophils Relative: 1 % (ref 0–1)
Eosinophils Absolute: 0.4 10*3/uL (ref 0.0–0.7)
Eosinophils Relative: 5 % (ref 0–5)
HEMATOCRIT: 43.6 % (ref 39.0–52.0)
Hemoglobin: 14.9 g/dL (ref 13.0–17.0)
LYMPHS PCT: 32 % (ref 12–46)
Lymphs Abs: 2.9 10*3/uL (ref 0.7–4.0)
MCH: 33.4 pg (ref 26.0–34.0)
MCHC: 34.2 g/dL (ref 30.0–36.0)
MCV: 97.8 fL (ref 78.0–100.0)
MONO ABS: 0.7 10*3/uL (ref 0.1–1.0)
Monocytes Relative: 8 % (ref 3–12)
NEUTROS ABS: 4.8 10*3/uL (ref 1.7–7.7)
NEUTROS PCT: 54 % (ref 43–77)
Platelets: 245 10*3/uL (ref 150–400)
RBC: 4.46 MIL/uL (ref 4.22–5.81)
RDW: 13.5 % (ref 11.5–15.5)
WBC: 8.8 10*3/uL (ref 4.0–10.5)

## 2014-06-22 LAB — COMPREHENSIVE METABOLIC PANEL
ALT: 13 U/L (ref 0–53)
AST: 16 U/L (ref 0–37)
Albumin: 4 g/dL (ref 3.5–5.2)
Alkaline Phosphatase: 67 U/L (ref 39–117)
Anion gap: 16 — ABNORMAL HIGH (ref 5–15)
BILIRUBIN TOTAL: 0.7 mg/dL (ref 0.3–1.2)
BUN: 9 mg/dL (ref 6–23)
CHLORIDE: 99 meq/L (ref 96–112)
CO2: 26 mEq/L (ref 19–32)
CREATININE: 1.02 mg/dL (ref 0.50–1.35)
Calcium: 8.8 mg/dL (ref 8.4–10.5)
GFR calc Af Amer: >90 mL/min (ref >90)
GFR calc non Af Amer: >90 mL/min (ref >90)
Glucose, Bld: 92 mg/dL (ref 70–99)
Potassium: 3.9 mEq/L (ref 3.7–5.3)
Sodium: 141 mEq/L (ref 137–147)
Total Protein: 7 g/dL (ref 6.0–8.3)

## 2014-06-22 MED ORDER — TETANUS-DIPHTH-ACELL PERTUSSIS 5-2.5-18.5 LF-MCG/0.5 IM SUSP
0.5000 mL | Freq: Once | INTRAMUSCULAR | Status: AC
  Administered 2014-06-23: 0.5 mL via INTRAMUSCULAR
  Filled 2014-06-22: qty 0.5

## 2014-06-22 MED ORDER — CEPHALEXIN 500 MG PO CAPS: 500.0000 mg | ORAL_CAPSULE | Freq: Four times a day (QID) | ORAL | Status: DC

## 2014-06-22 MED ORDER — IBUPROFEN 800 MG PO TABS
800.0000 mg | ORAL_TABLET | Freq: Once | ORAL | Status: AC
  Administered 2014-06-23: 800 mg via ORAL
  Filled 2014-06-22: qty 1

## 2014-06-22 MED ORDER — OXYCODONE-ACETAMINOPHEN 5-325 MG PO TABS
1.0000 | ORAL_TABLET | Freq: Once | ORAL | Status: AC
  Administered 2014-06-22: 1 via ORAL
  Filled 2014-06-22: qty 1

## 2014-06-22 MED ORDER — DIPHENHYDRAMINE HCL 25 MG PO TABS: 25.0000 mg | ORAL_TABLET | Freq: Four times a day (QID) | ORAL | Status: DC | PRN

## 2014-06-22 MED ORDER — IBUPROFEN 800 MG PO TABS: 800.0000 mg | ORAL_TABLET | Freq: Three times a day (TID) | ORAL | Status: DC | PRN

## 2014-06-22 NOTE — ED Notes (Signed)
Pt is here with right swelling, pain, and redness since last nite and thinks something bite him.  Pulse present.

## 2014-06-22 NOTE — Discharge Instructions (Signed)
Read the information below.  Use the prescribed medication as directed.  Please discuss all new medications with your pharmacist.  You may return to the Emergency Department at any time for worsening condition or any new symptoms that concern you.  If you develop redness, swelling, pus draining from the wound, uncontrolled pain, or fevers greater than 100.4, return to the ER immediately for a recheck.      Edema Edema is an abnormal buildup of fluids. It is more common in your legs and thighs. Painless swelling of the feet and ankles is more likely as a person ages. It also is common in looser skin, like around your eyes. HOME CARE   Keep the affected body part above the level of the heart while lying down.  Do not sit still or stand for a long time.  Do not put anything right under your knees when you lie down.  Do not wear tight clothes on your upper legs.  Exercise your legs to help the puffiness (swelling) go down.  Wear elastic bandages or support stockings as told by your doctor.  A low-salt diet may help lessen the puffiness.  Only take medicine as told by your doctor. GET HELP IF:  Treatment is not working.  You have heart, liver, or kidney disease and notice that your skin looks puffy or shiny.  You have puffiness in your legs that does not get better when you raise your legs.  You have sudden weight gain for no reason. GET HELP RIGHT AWAY IF:   You have shortness of breath or chest pain.  You cannot breathe when you lie down.  You have pain, redness, or warmth in the areas that are puffy.  You have heart, liver, or kidney disease and get edema all of a sudden.  You have a fever and your symptoms get worse all of a sudden. MAKE SURE YOU:   Understand these instructions.  Will watch your condition.  Will get help right away if you are not doing well or get worse. Document Released: 05/17/2008 Document Revised: 12/04/2013 Document Reviewed:  09/21/2013 [facility] Patient Information 2015 [place], [place]. This information is not intended to replace advice given to you by your health care provider. Make sure you discuss any questions you have with your health care provider.  Musculoskeletal Pain Musculoskeletal pain is muscle and boney aches and pains. These pains can occur in any part of the body. Your caregiver may treat you without knowing the cause of the pain. They may treat you if blood or urine tests, X-rays, and other tests were normal.  CAUSES There is often not a definite cause or reason for these pains. These pains may be caused by a type of germ (virus). The discomfort may also come from overuse. Overuse includes working out too hard when your body is not fit. Boney aches also come from weather changes. Bone is sensitive to atmospheric pressure changes. HOME CARE INSTRUCTIONS   Ask when your test results will be ready. Make sure you get your test results.  Only take over-the-counter or prescription medicines for pain, discomfort, or fever as directed by your caregiver. If you were given medications for your condition, do not drive, operate machinery or power tools, or sign legal documents for 24 hours. Do not drink alcohol. Do not take sleeping pills or other medications that may interfere with treatment.  Continue all activities unless the activities cause more pain. When the pain lessens, slowly resume normal activities. Gradually increase  the intensity and duration of the activities or exercise.  During periods of severe pain, bed rest may be helpful. Lay or sit in any position that is comfortable.  Putting ice on the injured area.  Put ice in a bag.  Place a towel between your skin and the bag.  Leave the ice on for 15 to 20 minutes, 3 to 4 times a day.  Follow up with your caregiver for continued problems and no reason can be found for the pain. If the pain becomes worse or does not go away, it may be necessary to  repeat tests or do additional testing. Your caregiver may need to look further for a possible cause. SEEK IMMEDIATE MEDICAL CARE IF:  You have pain that is getting worse and is not relieved by medications.  You develop chest pain that is associated with shortness or breath, sweating, feeling sick to your stomach (nauseous), or throw up (vomit).  Your pain becomes localized to the abdomen.  You develop any new symptoms that seem different or that concern you. MAKE SURE YOU:   Understand these instructions.  Will watch your condition.  Will get help right away if you are not doing well or get worse. Document Released: 11/29/2005 Document Revised: 02/21/2012 Document Reviewed: 08/03/2013 [facility] Patient Information 2015 [place]. This information is not intended to replace advice given to you by your health care provider. Make sure you discuss any questions you have with your health care provider.

## 2014-06-22 NOTE — ED Provider Notes (Signed)
CSN: [id number]     Arrival date & time 06/22/14  2032 History  This chart was scribed for non-physician practitioner working with Glynn Octave, MD by Elveria Rising, ED Scribe. This patient was seen in room [facility] and the patient's care was started at 10:13 PM.   Chief Complaint  Patient presents with  . Foot Pain    swelling      The history is provided by the patient. No language interpreter was used.   HPI Comments: Greg Velasquez is a 23 y.o. male who presents to the Emergency Department with suspected insect bite on dorsum of right foot. Patient reports walking to his car last night and feeling a stinging sensation as if something bit him. Patient was unable to visualize a culprit. Patient reports immediately cleaning the site and icing his foot. Patient reports pain and swelling upon wakening those morning. Today he continued treating his foot with ice, elevation, Tylenol, and Advil, but denies relief. Patient currently rates pain at 8/10 and describes stinging pain, soreness throughout the foot and tightness. Patient reports exacerbated pain with ambulation. Patient denies falling or tripping. Patient denies itching at the site, fever, chest pain or shortness of breath. Patient has no medical issues.   Past Medical History  Diagnosis Date  . Chronic back pain greater than 3 months duration    Past Surgical History  Procedure Laterality Date  . Wrist fracture surgery     No family history on file. History  Substance Use Topics  . Smoking status: Current Every Day Smoker -- 1.00 packs/day    Types: Cigarettes  . Smokeless tobacco: Not on file  . Alcohol Use: No    Review of Systems  A complete 10 system review of systems was obtained and all systems are negative except as noted in the HPI and PMH.    Allergies  Vicodin  Home Medications   Prior to Admission medications   Not on File   Triage Vitals: BP 136/78  Pulse 90  Temp(Src) 98.1 F (36.7 C) (Oral)   Resp 18  Ht 6' (1.829 m)  Wt 250 lb (113.399 kg)  BMI 33.90 kg/m2  SpO2 97% Physical Exam  Nursing note and vitals reviewed. Constitutional: He appears well-developed and well-nourished. No distress.  HENT:  Head: Normocephalic and atraumatic.  Neck: Neck supple.  Pulmonary/Chest: Effort normal.  Musculoskeletal: He exhibits tenderness.       Feet:  Right foot is edematous.  Diffuse tenderness over right foot and right calf.  No erythema or warmth.  No calf edema.   Compartments are soft.    Neurological: He is alert.  Skin: He is not diaphoretic.    ED Course  Procedures (including critical care time) COORDINATION OF CARE: 10:23 PM- Discussed treatment plan with patient at bedside and patient agreed to plan.   Labs Review Labs Reviewed - No data to display  Imaging Review No results found.   EKG Interpretation None      10:31 PM Discussed pt with Dr Manus Gunning.   MDM   Final diagnoses:  Pain of right lower extremity  Edema of right foot    Pt with unknown injury to right dorsal foot last night around midnight.  Felt like a bite.  Has had pain, swelling today.  Right foot is edematous and there is an abrasion or puncture wound.  No erythema or discharge.  Possible cellulitis vs more likely local reaction to insect sting or bite.  Doubt snake bite.  Labs unremarkable.  Pt did not fall or otherwise injury foot, therefore imaging not ordered.  Pt also seen and examined by Dr Manus Gunningancour - discussed plan with Dr Manus Gunningancour.  D/C home with keflex, NSAIDs, benadryl.  Discussed result, findings, treatment, and follow up  with patient.  Pt given return precautions.  Pt verbalizes understanding.   I personally performed the services described in this documentation, which was scribed in my presence. The recorded information has been reviewed and is accurate.    SankertownEmily Cosandra Plouffe, PA-C 06/23/14 [phone]

## 2014-06-23 DIAGNOSIS — M79609 Pain in unspecified limb: Secondary | ICD-10-CM | POA: Diagnosis not present

## 2014-06-23 NOTE — ED Provider Notes (Signed)
Medical screening examination/treatment/procedure(s) were conducted as a shared visit with non-physician practitioner(s) and myself.  I personally evaluated the patient during the encounter.  Pain and swelling to R dorsal foot x24 hours after feeling bite or sting last night in tall grass. Abrasion to R dorsal foot with edema.  Intact distal pulses, compartments soft   EKG Interpretation None       Glynn OctaveStephen Scotland Dost, MD 06/23/14 [phone]

## 2014-07-03 ENCOUNTER — Encounter (HOSPITAL_COMMUNITY): Payer: Self-pay | Admitting: Emergency Medicine

## 2014-07-03 ENCOUNTER — Emergency Department (INDEPENDENT_AMBULATORY_CARE_PROVIDER_SITE_OTHER)
Admission: EM | Admit: 2014-07-03 | Discharge: 2014-07-03 | Disposition: A | Discharge status: Home / Self Care | Payer: Medicare Other | Source: Home / Self Care | Attending: Family Medicine | Admitting: Family Medicine

## 2014-07-03 DIAGNOSIS — K0889 Other specified disorders of teeth and supporting structures: Secondary | ICD-10-CM

## 2014-07-03 DIAGNOSIS — K089 Disorder of teeth and supporting structures, unspecified: Secondary | ICD-10-CM | POA: Diagnosis not present

## 2014-07-03 MED ORDER — TRAMADOL HCL 50 MG PO TABS: 50.0000 mg | ORAL_TABLET | Freq: Four times a day (QID) | ORAL | Status: DC | PRN

## 2014-07-03 MED ORDER — AMOXICILLIN 500 MG PO CAPS: 500.0000 mg | ORAL_CAPSULE | Freq: Three times a day (TID) | ORAL | Status: DC

## 2014-07-03 NOTE — Discharge Instructions (Signed)
Thank you for coming in today. See a dentist ASAP.   [facility]:  [facility] [phone] (ext [phone])  [phone]  Please call Dr. Lawrence Marseillesivils office [phone] or cell [phone] [address], [place]  Cost for tooth removal $200 includes exam, Xray, and extraction and follow up visit.  Bring list of current medications with you.   [facility] - [phone]  [facility] [phone]  [address] [facility]  [facility]  [address], [place], [place], [address]  [phone], Ext. [phone]  2nd and 4th Thursday of the month at 6:30am (Simple extractions only - no wisdom teeth or surgery) First come/First serve -First 10 clients served  [facility] [place] residents only)  [address], [place], [place], [address]  336 [phone]  [facility]  336 [phone]  [facility]  336 [phone]  [facility]  [phone]  Please call [facility] at [phone] to get the details to get your tooth pulled.   [facility].  FREE August 28-29th  [place], [place]  [address] [place]  [place], [place] [address]  Wristbands will be handed out at 6 PM on Thursday, August 27th.   Dental Pain A tooth ache may be caused by cavities (tooth decay). Cavities expose the nerve of the tooth to air and hot or cold temperatures. It may come from an infection or abscess (also called a boil or furuncle) around your tooth. It is also often caused by dental caries (tooth decay). This causes the pain you are having. DIAGNOSIS  Your caregiver can diagnose this problem by exam. TREATMENT   If caused by an infection, it may be treated with medications which kill germs (antibiotics) and pain medications as prescribed by your caregiver. Take medications  as directed.  Only take over-the-counter or prescription medicines for pain, discomfort, or fever as directed by your caregiver.  Whether the tooth ache today is caused by infection or dental disease, you should see your dentist as soon as possible for further care. SEEK MEDICAL CARE IF: The exam and treatment you received today has been provided on an emergency basis only. This is not a substitute for complete medical or dental care. If your problem worsens or new problems (symptoms) appear, and you are unable to meet with your dentist, call or return to this location. SEEK IMMEDIATE MEDICAL CARE IF:   You have a fever.  You develop redness and swelling of your face, jaw, or neck.  You are unable to open your mouth.  You have severe pain uncontrolled by pain medicine. MAKE SURE YOU:   Understand these instructions.  Will watch your condition.  Will get help right away if you are not doing well or get worse. Document Released: 11/29/2005 Document Revised: 02/21/2012 Document Reviewed: 07/17/2008 [facility] Patient Information 2015 [place], [place]. This information is not intended to replace advice given to you by your health care provider. Make sure you discuss any questions you have with your health care provider.

## 2014-07-03 NOTE — ED Provider Notes (Signed)
Greg Velasquez is a 23 y.o. male who presents to Urgent Care today for tooth pain. Patient has a two-day history of severe right lower molar pain. He denies any injury fevers or chills nausea vomiting or diarrhea. He has tried multiple over-the-counter medications which have not helped. The pain is severe and worse with chewing and eating. He is unable to eat because of the pain. He has not yet contacted a dentist.   Past Medical History  Diagnosis Date  . Chronic back pain greater than 3 months duration    History  Substance Use Topics  . Smoking status: Current Every Day Smoker -- 1.00 packs/day    Types: Cigarettes  . Smokeless tobacco: Not on file  . Alcohol Use: No   ROS as above Medications: No current facility-administered medications for this encounter.   Current Outpatient Prescriptions  Medication Sig Dispense Refill  . amoxicillin (AMOXIL) 500 MG capsule Take 1 capsule (500 mg total) by mouth 3 (three) times daily.  21 capsule  0  . traMADol (ULTRAM) 50 MG tablet Take 1 tablet (50 mg total) by mouth every 6 (six) hours as needed.  15 tablet  0  . [DISCONTINUED] diphenhydrAMINE (BENADRYL) 25 MG tablet Take 1 tablet (25 mg total) by mouth every 6 (six) hours as needed (swelling).  20 tablet  0    Exam:  BP 131/79  Pulse 79  Temp(Src) 97.2 F (36.2 C) (Oral)  Resp 16  SpO2 97% Gen: Well NAD HEENT: EOMI,  MMM right lower rear molar normal-appearing with mild gum line erythema. Tender to touch.  Dental injection: Consent obtained Topical numbing medicine applied to the base of the tooth 1.8 mL of Marcaine and epinephrine were injected into the base of the tooth at the junction of the gum and cheek achieving good anesthesia. Patient tolerated procedure well.   Assessment and Plan: 23 y.o. male with tooth pain. Possibly due to dental carrie.  Dental injection provided temporary pain control. Plan to use amoxicillin tramadol. Followup with dentist as soon as possible.  Dental provider resources provided  Discussed warning signs or symptoms. Please see discharge instructions. Patient expresses understanding.   This note was created using Conservation officer, historic buildingsDragon voice recognition software. Any transcription errors are unintended.    Rodolph BongEvan S Ardella Chhim, MD 07/03/14 [phone]

## 2014-07-03 NOTE — ED Notes (Signed)
C/o right bottom tooth dental pain for two days States tooth is sensitive to hot and cold items States he was unable to sleep due to pain States no appt with dentist Would like any info on low cost dentist

## 2014-09-05 ENCOUNTER — Encounter (HOSPITAL_COMMUNITY): Payer: Self-pay | Admitting: Emergency Medicine

## 2014-09-05 ENCOUNTER — Emergency Department (HOSPITAL_COMMUNITY): Payer: Medicare Other

## 2014-09-05 ENCOUNTER — Emergency Department (HOSPITAL_COMMUNITY)
Admission: EM | Admit: 2014-09-05 | Discharge: 2014-09-06 | Disposition: A | Discharge status: Home / Self Care | Payer: Medicare Other | Attending: Emergency Medicine | Admitting: Emergency Medicine

## 2014-09-05 DIAGNOSIS — S99919A Unspecified injury of unspecified ankle, initial encounter: Principal | ICD-10-CM

## 2014-09-05 DIAGNOSIS — F17200 Nicotine dependence, unspecified, uncomplicated: Secondary | ICD-10-CM | POA: Diagnosis not present

## 2014-09-05 DIAGNOSIS — Y9289 Other specified places as the place of occurrence of the external cause: Secondary | ICD-10-CM | POA: Insufficient documentation

## 2014-09-05 DIAGNOSIS — Z792 Long term (current) use of antibiotics: Secondary | ICD-10-CM | POA: Diagnosis not present

## 2014-09-05 DIAGNOSIS — S99929A Unspecified injury of unspecified foot, initial encounter: Secondary | ICD-10-CM | POA: Diagnosis not present

## 2014-09-05 DIAGNOSIS — W2209XA Striking against other stationary object, initial encounter: Secondary | ICD-10-CM | POA: Insufficient documentation

## 2014-09-05 DIAGNOSIS — G8929 Other chronic pain: Secondary | ICD-10-CM | POA: Diagnosis not present

## 2014-09-05 DIAGNOSIS — M25579 Pain in unspecified ankle and joints of unspecified foot: Secondary | ICD-10-CM | POA: Diagnosis not present

## 2014-09-05 DIAGNOSIS — Y9389 Activity, other specified: Secondary | ICD-10-CM | POA: Diagnosis not present

## 2014-09-05 DIAGNOSIS — S8990XA Unspecified injury of unspecified lower leg, initial encounter: Secondary | ICD-10-CM | POA: Insufficient documentation

## 2014-09-05 DIAGNOSIS — M79609 Pain in unspecified limb: Secondary | ICD-10-CM | POA: Diagnosis not present

## 2014-09-05 DIAGNOSIS — M79672 Pain in left foot: Secondary | ICD-10-CM

## 2014-09-05 MED ORDER — OXYCODONE-ACETAMINOPHEN 5-325 MG PO TABS
2.0000 | ORAL_TABLET | Freq: Once | ORAL | Status: AC
  Administered 2014-09-06: 2 via ORAL
  Filled 2014-09-05: qty 2

## 2014-09-05 NOTE — ED Notes (Signed)
XRAY called to confirm xray changes.

## 2014-09-05 NOTE — ED Provider Notes (Signed)
CSN: [id number]     Arrival date & time 09/05/14  2325 History   First MD Initiated Contact with Patient 09/05/14 2331     Chief Complaint  Patient presents with  . Toe Injury     (Consider location/radiation/quality/duration/timing/severity/associated sxs/prior Treatment) Patient is a 22 y.o. male presenting with toe pain. The history is provided by the patient.  Toe Pain This is a new problem. The current episode started 3 to 5 hours ago. The problem occurs constantly. The problem has not changed since onset.Pertinent negatives include no chest pain, no abdominal pain, no headaches and no shortness of breath. The symptoms are aggravated by walking. Nothing relieves the symptoms. He has tried nothing for the symptoms. The treatment provided no relief.    Past Medical History  Diagnosis Date  . Chronic back pain greater than 3 months duration    Past Surgical History  Procedure Laterality Date  . Wrist fracture surgery     No family history on file. History  Substance Use Topics  . Smoking status: Current Every Day Smoker -- 1.00 packs/day    Types: Cigarettes  . Smokeless tobacco: Not on file  . Alcohol Use: No    Review of Systems  Constitutional: Negative for fever.  HENT: Negative for drooling and rhinorrhea.   Eyes: Negative for pain.  Respiratory: Negative for cough and shortness of breath.   Cardiovascular: Negative for chest pain and leg swelling.  Gastrointestinal: Negative for nausea, vomiting, abdominal pain and diarrhea.  Genitourinary: Negative for dysuria and hematuria.  Musculoskeletal: Negative for gait problem and neck pain.  Skin: Negative for color change.  Neurological: Negative for numbness and headaches.  Hematological: Negative for adenopathy.  Psychiatric/Behavioral: Negative for behavioral problems.  All other systems reviewed and are negative.     Allergies  Vicodin  Home Medications   Prior to Admission medications   Medication Sig  Start Date End Date Taking? Authorizing Provider  amoxicillin (AMOXIL) 500 MG capsule Take 1 capsule (500 mg total) by mouth 3 (three) times daily. 07/03/14   Rodolph Bong, MD  traMADol (ULTRAM) 50 MG tablet Take 1 tablet (50 mg total) by mouth every 6 (six) hours as needed. 07/03/14   Rodolph Bong, MD   BP 131/78  Pulse 111  Temp(Src) 97.9 F (36.6 C) (Oral)  Resp 18  Ht  (1.88 m)  Wt 220 lb (99.791 kg)  BMI 28.23 kg/m2  SpO2 97% Physical Exam  Nursing note and vitals reviewed. Constitutional: He is oriented to person, place, and time. He appears well-developed and well-nourished.  HENT:  Head: Normocephalic and atraumatic.  Right Ear: External ear normal.  Left Ear: External ear normal.  Nose: Nose normal.  Mouth/Throat: Oropharynx is clear and moist. No oropharyngeal exudate.  Eyes: Conjunctivae and EOM are normal. Pupils are equal, round, and reactive to light.  Neck: Normal range of motion. Neck supple.  Cardiovascular: Normal rate, regular rhythm, normal heart sounds and intact distal pulses.  Exam reveals no gallop and no friction rub.   No murmur heard. Pulmonary/Chest: Effort normal and breath sounds normal. No respiratory distress. He has no wheezes.  Abdominal: Soft. Bowel sounds are normal. He exhibits no distension. There is no tenderness. There is no rebound and no guarding.  Musculoskeletal: He exhibits tenderness. He exhibits no edema.  2+ distal pulses in the left lower extremity.  Diffuse tenderness to palpation of the digits of the left foot and the distal dorsum of the foot as  well.  Mildly limited range of motion of the left ankle but no focal tenderness of the left ankle.  Neurological: He is alert and oriented to person, place, and time.  Skin: Skin is warm and dry.  Psychiatric: He has a normal mood and affect. His behavior is normal.    ED Course  Procedures (including critical care time) Labs Review Labs Reviewed - No data to display  Imaging  Review Dg Ankle Complete Left  09/06/2014   CLINICAL DATA:  Left ankle pain after kicking a door  EXAM: LEFT ANKLE COMPLETE - 3+ VIEW  COMPARISON:  None.  FINDINGS: There is no evidence of fracture, dislocation, or joint effusion. There is no evidence of arthropathy or other focal bone abnormality. Soft tissues are unremarkable.  IMPRESSION: Negative.   Electronically Signed   By: Esperanza Heir M.D.   On: 09/06/2014 00:10   Dg Foot Complete Left  09/06/2014   CLINICAL DATA:  Foot pain in all digits after kicking door  EXAM: LEFT FOOT - COMPLETE 3+ VIEW  COMPARISON:  04/24/2013  FINDINGS: There is no evidence of fracture or dislocation.  Mild degenerative narrowing and spurring at the first MTP joint.  IMPRESSION: No acute osseous findings.   Electronically Signed   By: Tiburcio Pea M.D.   On: 09/06/2014 00:10     EKG Interpretation None      MDM   Final diagnoses:  Left foot pain    11:45 PM 23 y.o. male who presents with left toe pain which began after kicking a door 4 hours ago. He is afebrile and mildly tachycardic here. Will get pain control and plain film imaging.  12:29 AM: I interpreted/reviewed the labs and/or imaging which were non-contributory.  I have discussed the diagnosis/risks/treatment options with the patient and believe the pt to be eligible for discharge home to follow-up with his pcp as needed. We also discussed returning to the ED immediately if new or worsening sx occur. We discussed the sx which are most concerning (e.g., worsening pain) that necessitate immediate return. Medications administered to the patient during their visit and any new prescriptions provided to the patient are listed below.  Medications given during this visit Medications  oxyCODONE-acetaminophen (PERCOCET/ROXICET) 5-325 MG per tablet 2 tablet (2 tablets Oral Given 09/06/14 0011)    Discharge Medication List as of 09/06/2014 12:29 AM    START taking these medications   Details   oxyCODONE-acetaminophen (PERCOCET) 5-325 MG per tablet Take 1-2 tablets by mouth every 6 (six) hours as needed for moderate pain., Starting 09/06/2014, Until Discontinued, Print         Purvis Sheffield, MD 09/06/14 [id number]

## 2014-09-05 NOTE — ED Notes (Signed)
Patient transported to X-ray 

## 2014-09-05 NOTE — ED Notes (Signed)
MD at bedside. 

## 2014-09-05 NOTE — ED Notes (Signed)
Per EMS pt states he kicked a door earlier, pt left pinky, fourth toe, and middle toe pain. Pt reports he is unable to bare weight on his left foot. Pt is A&OX4.

## 2014-09-06 DIAGNOSIS — M79609 Pain in unspecified limb: Secondary | ICD-10-CM | POA: Diagnosis not present

## 2014-09-06 DIAGNOSIS — S99929A Unspecified injury of unspecified foot, initial encounter: Secondary | ICD-10-CM | POA: Diagnosis not present

## 2014-09-06 DIAGNOSIS — M25579 Pain in unspecified ankle and joints of unspecified foot: Secondary | ICD-10-CM | POA: Diagnosis not present

## 2014-09-06 DIAGNOSIS — S8990XA Unspecified injury of unspecified lower leg, initial encounter: Secondary | ICD-10-CM | POA: Diagnosis not present

## 2014-09-06 MED ORDER — OXYCODONE-ACETAMINOPHEN 5-325 MG PO TABS: 1.0000 | ORAL_TABLET | Freq: Four times a day (QID) | ORAL | Status: DC | PRN

## 2014-09-06 NOTE — ED Notes (Signed)
MD at bedside. 

## 2014-09-24 ENCOUNTER — Encounter (HOSPITAL_COMMUNITY): Payer: Self-pay | Admitting: Emergency Medicine

## 2014-09-24 ENCOUNTER — Emergency Department (HOSPITAL_COMMUNITY)
Admission: EM | Admit: 2014-09-24 | Discharge: 2014-09-24 | Disposition: A | Discharge status: Home / Self Care | Payer: Medicare Other | Attending: Emergency Medicine | Admitting: Emergency Medicine

## 2014-09-24 DIAGNOSIS — G8929 Other chronic pain: Secondary | ICD-10-CM | POA: Diagnosis not present

## 2014-09-24 DIAGNOSIS — F1310 Sedative, hypnotic or anxiolytic abuse, uncomplicated: Secondary | ICD-10-CM | POA: Insufficient documentation

## 2014-09-24 DIAGNOSIS — R52 Pain, unspecified: Secondary | ICD-10-CM | POA: Diagnosis not present

## 2014-09-24 DIAGNOSIS — Z720 Tobacco use: Secondary | ICD-10-CM | POA: Insufficient documentation

## 2014-09-24 DIAGNOSIS — R197 Diarrhea, unspecified: Secondary | ICD-10-CM | POA: Insufficient documentation

## 2014-09-24 DIAGNOSIS — M791 Myalgia: Secondary | ICD-10-CM | POA: Insufficient documentation

## 2014-09-24 DIAGNOSIS — F1910 Other psychoactive substance abuse, uncomplicated: Secondary | ICD-10-CM

## 2014-09-24 DIAGNOSIS — F1110 Opioid abuse, uncomplicated: Secondary | ICD-10-CM | POA: Diagnosis not present

## 2014-09-24 DIAGNOSIS — F1210 Cannabis abuse, uncomplicated: Secondary | ICD-10-CM | POA: Diagnosis not present

## 2014-09-24 DIAGNOSIS — R6889 Other general symptoms and signs: Secondary | ICD-10-CM | POA: Diagnosis not present

## 2014-09-24 DIAGNOSIS — R6883 Chills (without fever): Secondary | ICD-10-CM | POA: Diagnosis not present

## 2014-09-24 DIAGNOSIS — R109 Unspecified abdominal pain: Secondary | ICD-10-CM | POA: Insufficient documentation

## 2014-09-24 DIAGNOSIS — F1410 Cocaine abuse, uncomplicated: Secondary | ICD-10-CM | POA: Diagnosis not present

## 2014-09-24 LAB — HEPATITIS PANEL, ACUTE
HCV Ab: REACTIVE — AB
Hep A IgM: NONREACTIVE
Hep B C IgM: NONREACTIVE
Hepatitis B Surface Ag: NEGATIVE

## 2014-09-24 LAB — COMPREHENSIVE METABOLIC PANEL
ALBUMIN: 3.6 g/dL (ref 3.5–5.2)
ALT: 131 U/L — AB (ref 0–53)
AST: 106 U/L — ABNORMAL HIGH (ref 0–37)
Alkaline Phosphatase: 176 U/L — ABNORMAL HIGH (ref 39–117)
Anion gap: 15 (ref 5–15)
BUN: 6 mg/dL (ref 6–23)
CALCIUM: 9.3 mg/dL (ref 8.4–10.5)
CO2: 24 mEq/L (ref 19–32)
Chloride: 103 mEq/L (ref 96–112)
Creatinine, Ser: 0.8 mg/dL (ref 0.50–1.35)
GFR calc Af Amer: >90 mL/min (ref >90)
GFR calc non Af Amer: >90 mL/min (ref >90)
Glucose, Bld: 110 mg/dL — ABNORMAL HIGH (ref 70–99)
Potassium: 4 mEq/L (ref 3.7–5.3)
SODIUM: 142 meq/L (ref 137–147)
TOTAL PROTEIN: 7.8 g/dL (ref 6.0–8.3)
Total Bilirubin: 1.1 mg/dL (ref 0.3–1.2)

## 2014-09-24 LAB — CBC
HCT: 43.2 % (ref 39.0–52.0)
HEMOGLOBIN: 14.8 g/dL (ref 13.0–17.0)
MCH: 31.4 pg (ref 26.0–34.0)
MCHC: 34.3 g/dL (ref 30.0–36.0)
MCV: 91.5 fL (ref 78.0–100.0)
Platelets: 329 10*3/uL (ref 150–400)
RBC: 4.72 MIL/uL (ref 4.22–5.81)
RDW: 13.3 % (ref 11.5–15.5)
WBC: 13.9 10*3/uL — ABNORMAL HIGH (ref 4.0–10.5)

## 2014-09-24 LAB — RAPID URINE DRUG SCREEN, HOSP PERFORMED
AMPHETAMINES: NOT DETECTED
Barbiturates: NOT DETECTED
Benzodiazepines: POSITIVE — AB
Cocaine: POSITIVE — AB
OPIATES: POSITIVE — AB
TETRAHYDROCANNABINOL: POSITIVE — AB

## 2014-09-24 LAB — ETHANOL: Alcohol, Ethyl (B): <11 mg/dL (ref 0–11)

## 2014-09-24 LAB — ACETAMINOPHEN LEVEL

## 2014-09-24 LAB — SALICYLATE LEVEL

## 2014-09-24 MED ORDER — HYDROXYZINE HCL 25 MG PO TABS: 25.0000 mg | ORAL_TABLET | Freq: Four times a day (QID) | ORAL | Status: DC | PRN

## 2014-09-24 MED ORDER — CLONIDINE HCL 0.1 MG PO TABS: ORAL_TABLET | ORAL | Status: DC

## 2014-09-24 MED ORDER — ONDANSETRON 4 MG PO TBDP: 4.0000 mg | ORAL_TABLET | Freq: Four times a day (QID) | ORAL | Status: DC | PRN

## 2014-09-24 MED ORDER — DICYCLOMINE HCL 20 MG PO TABS: 20.0000 mg | ORAL_TABLET | Freq: Four times a day (QID) | ORAL | Status: DC | PRN

## 2014-09-24 MED ORDER — NAPROXEN 500 MG PO TABS: 500.0000 mg | ORAL_TABLET | Freq: Two times a day (BID) | ORAL | Status: DC | PRN

## 2014-09-24 MED ORDER — ONDANSETRON 4 MG PO TBDP
4.0000 mg | ORAL_TABLET | Freq: Four times a day (QID) | ORAL | Status: DC | PRN
  Administered 2014-09-24: 4 mg via ORAL
  Filled 2014-09-24: qty 1

## 2014-09-24 MED ORDER — LOPERAMIDE HCL 2 MG PO CAPS: 2.0000 mg | ORAL_CAPSULE | ORAL | Status: DC | PRN

## 2014-09-24 MED ORDER — CLONIDINE HCL 0.1 MG PO TABS
0.1000 mg | ORAL_TABLET | ORAL | Status: DC
  Administered 2014-09-24: 0.1 mg via ORAL
  Filled 2014-09-24: qty 1

## 2014-09-24 MED ORDER — LOPERAMIDE HCL 2 MG PO CAPS: 2.0000 mg | ORAL_CAPSULE | Freq: Two times a day (BID) | ORAL | Status: DC | PRN

## 2014-09-24 MED ORDER — METHOCARBAMOL 500 MG PO TABS: 500.0000 mg | ORAL_TABLET | Freq: Three times a day (TID) | ORAL | Status: DC | PRN

## 2014-09-24 NOTE — ED Notes (Signed)
Report given to Francena HanlyMartha H. RN

## 2014-09-24 NOTE — ED Provider Notes (Signed)
CSN: [id number]     Arrival date & time 09/24/14  0150 History   First MD Initiated Contact with Patient 09/24/14 [phone]     Chief Complaint  Patient presents with  . Addiction Problem     (Consider location/radiation/quality/duration/timing/severity/associated sxs/prior Treatment) The history is provided by the patient.   patient is requesting detox off of heroin. States she has been a heavy user for 2 and half months. Also occasional benzos and cocaine. Also states some alcohol. Last used heroin 2 days ago. His ejection user. No fevers. He is worried he may have hepatitis C because his girlfriend has hepatitis C and he has reused needles he states he is withdrawing now. States he has nausea vomiting and diarrhea. He states he hurts all over. States he has aches all over. He states they keep their own needles, but states there may have been a mixup. He denies suicidal or homicidal thoughts.  Past Medical History  Diagnosis Date  . Chronic back pain greater than 3 months duration    Past Surgical History  Procedure Laterality Date  . Wrist fracture surgery     No family history on file. History  Substance Use Topics  . Smoking status: Current Every Day Smoker -- 1.00 packs/day    Types: Cigarettes  . Smokeless tobacco: Not on file  . Alcohol Use: No    Review of Systems  Constitutional: Positive for chills and appetite change. Negative for fever and activity change.  Eyes: Negative for pain.  Respiratory: Negative for chest tightness and shortness of breath.   Cardiovascular: Negative for chest pain and leg swelling.  Gastrointestinal: Positive for nausea, abdominal pain and diarrhea. Negative for vomiting.  Genitourinary: Negative for flank pain.  Musculoskeletal: Positive for myalgias. Negative for back pain and neck stiffness.  Skin: Negative for rash.  Neurological: Negative for weakness, numbness and headaches.  Psychiatric/Behavioral: Negative for behavioral problems.       Allergies  Vicodin  Home Medications   Prior to Admission medications   Medication Sig Start Date End Date Taking? Authorizing Provider  cloNIDine (CATAPRES) 0.1 MG tablet 1 tab po bid in morning and evening for 3 doses, then in morning for 2 doses. 09/24/14   Juliet RudeNathan R. Rubin PayorPickering, MD  dicyclomine (BENTYL) 20 MG tablet Take 1 tablet (20 mg total) by mouth every 6 (six) hours as needed for spasms (abdominal cramping). 09/24/14   Juliet RudeNathan R. Neftaly Swiss, MD  hydrOXYzine (ATARAX/VISTARIL) 25 MG tablet Take 1 tablet (25 mg total) by mouth every 6 (six) hours as needed for anxiety. 09/24/14   Juliet RudeNathan R. Rubin PayorPickering, MD  loperamide (IMODIUM) 2 MG capsule Take 1-2 capsules (2-4 mg total) by mouth 2 (two) times daily as needed for diarrhea or loose stools (diarrhea). 09/24/14   Juliet RudeNathan R. Larin Depaoli, MD  methocarbamol (ROBAXIN) 500 MG tablet Take 1 tablet (500 mg total) by mouth every 8 (eight) hours as needed for muscle spasms. 09/24/14   Juliet RudeNathan R. Rubin PayorPickering, MD  naproxen (NAPROSYN) 500 MG tablet Take 1 tablet (500 mg total) by mouth 2 (two) times daily as needed (aching, pain, or discomfort). 09/24/14   Juliet RudeNathan R. Warren Kugelman, MD  ondansetron (ZOFRAN-ODT) 4 MG disintegrating tablet Take 1 tablet (4 mg total) by mouth every 6 (six) hours as needed for nausea or vomiting. 09/24/14   Juliet RudeNathan R. Stoy Fenn, MD   BP 96/43  Pulse 75  Temp(Src) 98 F (36.7 C) (Oral)  Resp 20  SpO2 96% Physical Exam  Nursing note and vitals reviewed.  Constitutional: He is oriented to person, place, and time. He appears well-developed and well-nourished.  HENT:  Head: Normocephalic and atraumatic.  Eyes: EOM are normal.  Pupils are somewhat dilated, but reactive  Neck: Normal range of motion. Neck supple.  Cardiovascular: Normal rate, regular rhythm and normal heart sounds.   No murmur heard. Pulmonary/Chest: Effort normal and breath sounds normal.  Abdominal: Soft. Bowel sounds are normal. He exhibits no distension.  There is no tenderness.  Musculoskeletal: Normal range of motion. He exhibits no edema.  Injection sites to bilateral antecubital fossa. Some tenderness without erythema on the left antecubital injection sites  Neurological: He is alert and oriented to person, place, and time. No cranial nerve deficit.  Skin: Skin is warm and dry.  Psychiatric: He has a normal mood and affect.    ED Course  Procedures (including critical care time) Labs Review Labs Reviewed  CBC - Abnormal; Notable for the following:    WBC 13.9 (*)    All other components within normal limits  COMPREHENSIVE METABOLIC PANEL - Abnormal; Notable for the following:    Glucose, Bld 110 (*)    AST 106 (*)    ALT 131 (*)    Alkaline Phosphatase 176 (*)    All other components within normal limits  SALICYLATE LEVEL - Abnormal; Notable for the following:    Salicylate Lvl <2.0 (*)    All other components within normal limits  URINE RAPID DRUG SCREEN (HOSP PERFORMED) - Abnormal; Notable for the following:    Opiates POSITIVE (*)    Cocaine POSITIVE (*)    Benzodiazepines POSITIVE (*)    Tetrahydrocannabinol POSITIVE (*)    All other components within normal limits  HEPATITIS PANEL, ACUTE - Abnormal; Notable for the following:    HCV Ab Reactive (*)    All other components within normal limits  ACETAMINOPHEN LEVEL  ETHANOL    Imaging Review No results found.   EKG Interpretation None      MDM   Final diagnoses:  Polysubstance abuse    Patient with substance abuse, primarily heroin. Has been seen by TTS. Has been given outpatient followup instructions. Will give clonidine protocol for home. Will discharge    Juliet Rudeathan R. Rubin PayorPickering, MD 09/24/14 1616

## 2014-09-24 NOTE — ED Notes (Signed)
PT states that has been using Heroine for 2.375months; pt states that he started using Heroine to stop using "pills"; pt denies SI / HI; pt states that he has been using 2 grams / day and last used 2 days ago; pt c/o feeling "shakey", sweaty and N/V; pt c/o back pain; pt states "I just need some medicine to get through this because I can't take it"; pt is requesting Subaxone

## 2014-09-24 NOTE — ED Notes (Signed)
Pt placed into scrubs, items have been placed at nursing station, 1 pair of flip flops, 1 brown jacket, 1 pair of jeans, has been waunded by security

## 2014-09-24 NOTE — Discharge Instructions (Signed)
Polysubstance Abuse °When people abuse more than one drug or type of drug it is called polysubstance or polydrug abuse. For example, many smokers also drink alcohol. This is one form of polydrug abuse. Polydrug abuse also refers to the use of a drug to counteract an unpleasant effect produced by another drug. It may also be used to help with withdrawal from another drug. People who take stimulants may become agitated. Sometimes this agitation is countered with a tranquilizer. This helps protect against the unpleasant side effects. Polydrug abuse also refers to the use of different drugs at the same time.  °Anytime drug use is interfering with normal living activities, it has become abuse. This includes problems with family and friends. Psychological dependence has developed when your mind tells you that the drug is needed. This is usually followed by physical dependence which has developed when continuing increases of drug are required to get the same feeling or "high". This is known as addiction or chemical dependency. A person's risk is much higher if there is a history of chemical dependency in the family. °SIGNS OF CHEMICAL DEPENDENCY °· You have been told by friends or family that drugs have become a problem. °· You fight when using drugs. °· You are having blackouts (not remembering what you do while using). °· You feel sick from using drugs but continue using. °· You lie about use or amounts of drugs (chemicals) used. °· You need chemicals to get you going. °· You are suffering in work performance or in school because of drug use. °· You get sick from use of drugs but continue to use anyway. °· You need drugs to relate to people or feel comfortable in social situations. °· You use drugs to forget problems. °"Yes" answered to any of the above signs of chemical dependency indicates there are problems. The longer the use of drugs continues, the greater the problems will become. °If there is a family history of  drug or alcohol use, it is best not to experiment with these drugs. Continual use leads to tolerance. After tolerance develops more of the drug is needed to get the same feeling. This is followed by addiction. With addiction, drugs become the most important part of life. It becomes more important to take drugs than participate in the other usual activities of life. This includes relating to friends and family. Addiction is followed by dependency. Dependency is a condition where drugs are now needed not just to get high, but to feel normal. °Addiction cannot be cured but it can be stopped. This often requires outside help and the care of professionals. Treatment centers are listed in the yellow pages under: Cocaine, Narcotics, and Alcoholics Anonymous. Most hospitals and clinics can refer you to a specialized care center. Talk to your caregiver if you need help. °Document Released: 07/21/2005 Document Revised: 02/21/2012 Document Reviewed: 11/29/2005 °[employer]® Patient Information ©2015 [employer]. This information is not intended to replace advice given to you by your health care provider. Make sure you discuss any questions you have with your health care provider. ° °Opioid Withdrawal °Opioids are a group of narcotic drugs. They include the street drug heroin. They also include pain medicines, such as morphine, hydrocodone, oxycodone, and fentanyl. Opioid withdrawal is a group of characteristic physical and mental signs and symptoms. It typically occurs if you have been using opioids daily for several weeks or longer and stop using or rapidly decrease use. Opioid withdrawal can also occur if you have used opioids daily   for a long time and are given a medicine to block the effect.  °SIGNS AND SYMPTOMS °Opioid withdrawal includes three or more of the following symptoms:  °· Depressed, anxious, or irritable mood. °· Nausea or vomiting. °· Muscle aches or spasms.   °· Watery eyes.    °· Runny nose. °· Dilated  pupils, sweating, or hairs standing on end. °· Diarrhea or intestinal cramping. °· Yawning.   °· Fever. °· Increased blood pressure. °· Fast pulse. °· Restlessness or trouble sleeping. °These signs and symptoms occur within several hours of stopping or reducing short-acting opioids, such as heroin. They can occur within 3 days of stopping or reducing long-acting opioids, such as methadone. Withdrawal begins within minutes of receiving a drug that blocks the effects of opioids, such as naltrexone or naloxone. °DIAGNOSIS  °Opioid use disorder is diagnosed by your health care provider. You will be asked about your symptoms, drug and alcohol use, medical history, and use of medicines. A physical exam may be done. Lab tests may be ordered. Your health care provider may have you see a mental health professional.  °TREATMENT  °The treatment for opioid withdrawal is usually provided by medical doctors with special training in substance use disorders (addiction specialists). The following medicines may be included in treatment: °· Opioids given in place of the abused opioid. They turn on opioid receptors in the brain and lessen or prevent withdrawal symptoms. They are gradually decreased (opioid substitution and taper). °· Non-opioids that can lessen certain opioid withdrawal symptoms. They may be used alone or with opioid substitution and taper. °Successful long-term recovery usually requires medicine, counseling, and group support. °HOME CARE INSTRUCTIONS  °· Take medicines only as directed by your health care provider. °· Check with your health care provider before starting new medicines. °· Keep all follow-up visits as directed by your health care provider. °SEEK MEDICAL CARE IF: °· You are not able to take your medicines as directed. °· Your symptoms get worse. °· You relapse. °SEEK IMMEDIATE MEDICAL CARE IF: °· You have serious thoughts about hurting yourself or others. °· You have a seizure. °· You lose  consciousness. °Document Released: 12/02/2003 Document Revised: 04/15/2014 Document Reviewed: 12/12/2013 °[employer]® Patient Information ©2015 [employer]. This information is not intended to replace advice given to you by your health care provider. Make sure you discuss any questions you have with your health care provider. ° °

## 2014-09-28 ENCOUNTER — Encounter (HOSPITAL_COMMUNITY): Payer: Self-pay | Admitting: Emergency Medicine

## 2014-09-28 ENCOUNTER — Emergency Department (HOSPITAL_COMMUNITY)
Admission: EM | Admit: 2014-09-28 | Discharge: 2014-09-28 | Disposition: A | Discharge status: Home / Self Care | Payer: Medicare Other | Attending: Emergency Medicine | Admitting: Emergency Medicine

## 2014-09-28 DIAGNOSIS — L02414 Cutaneous abscess of left upper limb: Secondary | ICD-10-CM | POA: Insufficient documentation

## 2014-09-28 DIAGNOSIS — Z720 Tobacco use: Secondary | ICD-10-CM | POA: Insufficient documentation

## 2014-09-28 DIAGNOSIS — L0291 Cutaneous abscess, unspecified: Secondary | ICD-10-CM

## 2014-09-28 DIAGNOSIS — G8929 Other chronic pain: Secondary | ICD-10-CM | POA: Insufficient documentation

## 2014-09-28 DIAGNOSIS — Z23 Encounter for immunization: Secondary | ICD-10-CM | POA: Diagnosis not present

## 2014-09-28 MED ORDER — NAPROXEN 500 MG PO TABS
500.0000 mg | ORAL_TABLET | Freq: Two times a day (BID) | ORAL | Status: DC
Start: 2014-09-28 — End: 2015-12-01

## 2014-09-28 MED ORDER — LIDOCAINE-EPINEPHRINE (PF) 2 %-1:200000 IJ SOLN
10.0000 mL | Freq: Once | INTRAMUSCULAR | Status: AC
  Administered 2014-09-28: 10 mL
  Filled 2014-09-28: qty 20

## 2014-09-28 MED ORDER — TETANUS-DIPHTH-ACELL PERTUSSIS 5-2.5-18.5 LF-MCG/0.5 IM SUSP
0.5000 mL | Freq: Once | INTRAMUSCULAR | Status: AC
  Administered 2014-09-28: 0.5 mL via INTRAMUSCULAR
  Filled 2014-09-28: qty 0.5

## 2014-09-28 MED ORDER — NAPROXEN 250 MG PO TABS
500.0000 mg | ORAL_TABLET | Freq: Once | ORAL | Status: AC
  Administered 2014-09-28: 500 mg via ORAL
  Filled 2014-09-28: qty 2

## 2014-09-28 MED ORDER — DOXYCYCLINE HYCLATE 100 MG PO CAPS: 100.0000 mg | ORAL_CAPSULE | Freq: Two times a day (BID) | ORAL | Status: DC

## 2014-09-28 NOTE — ED Notes (Signed)
Pt A&OX4, ambulatory at d/c with steady gait, NAD 

## 2014-09-28 NOTE — ED Notes (Signed)
(  Denies: sx other than pain, redness and swelling), noted to L AC area.

## 2014-09-28 NOTE — ED Notes (Signed)
The pt was stuck with a rusty nail approx 4 days ago.  The area is his lt elbow  Ap-c area.  Red and sl swelling with pain

## 2014-09-28 NOTE — ED Notes (Signed)
[facility]

## 2014-09-28 NOTE — ED Notes (Signed)
Dr. Lynelle DoctorKnapp at bedside [facility]

## 2014-09-28 NOTE — ED Provider Notes (Signed)
CSN: [id number]     Arrival date & time 09/28/14  0039 History   First MD Initiated Contact with Patient 09/28/14 [phone]     Chief Complaint  Patient presents with  . Puncture Wound    HPI Patient presented to the emergency room for evaluation of a swollen area in the left antecubital fossa. Patient states that a rusty nail scratched him about 4 days ago. Since that time he has developed swelling and increasing tenderness in the left a.c. area. He denies any fevers or chills he denies any numbness or weakness. Patient has a history of heroin abuse. He denies that he injected any heroin in that area. Past Medical History  Diagnosis Date  . Chronic back pain greater than 3 months duration    Past Surgical History  Procedure Laterality Date  . Wrist fracture surgery     No family history on file. History  Substance Use Topics  . Smoking status: Current Every Day Smoker -- 1.00 packs/day    Types: Cigarettes  . Smokeless tobacco: Not on file  . Alcohol Use: No    Review of Systems  All other systems reviewed and are negative.     Allergies  Vicodin  Home Medications   Prior to Admission medications   Medication Sig Start Date End Date Taking? Authorizing Provider  cloNIDine (CATAPRES) 0.1 MG tablet 1 tab po bid in morning and evening for 3 doses, then in morning for 2 doses. 09/24/14   Juliet RudeNathan R. Rubin PayorPickering, MD  dicyclomine (BENTYL) 20 MG tablet Take 1 tablet (20 mg total) by mouth every 6 (six) hours as needed for spasms (abdominal cramping). 09/24/14   Juliet RudeNathan R. Pickering, MD  doxycycline (VIBRAMYCIN) 100 MG capsule Take 1 capsule (100 mg total) by mouth 2 (two) times daily. 09/28/14   Linwood DibblesJon Alize Acy, MD  hydrOXYzine (ATARAX/VISTARIL) 25 MG tablet Take 1 tablet (25 mg total) by mouth every 6 (six) hours as needed for anxiety. 09/24/14   Juliet RudeNathan R. Rubin PayorPickering, MD  loperamide (IMODIUM) 2 MG capsule Take 1-2 capsules (2-4 mg total) by mouth 2 (two) times daily as needed for diarrhea or  loose stools (diarrhea). 09/24/14   Juliet RudeNathan R. Pickering, MD  methocarbamol (ROBAXIN) 500 MG tablet Take 1 tablet (500 mg total) by mouth every 8 (eight) hours as needed for muscle spasms. 09/24/14   Juliet RudeNathan R. Rubin PayorPickering, MD  naproxen (NAPROSYN) 500 MG tablet Take 1 tablet (500 mg total) by mouth 2 (two) times daily as needed (aching, pain, or discomfort). 09/24/14   Juliet RudeNathan R. Pickering, MD  naproxen (NAPROSYN) 500 MG tablet Take 1 tablet (500 mg total) by mouth 2 (two) times daily. 09/28/14   Linwood DibblesJon Emilly Lavey, MD  ondansetron (ZOFRAN-ODT) 4 MG disintegrating tablet Take 1 tablet (4 mg total) by mouth every 6 (six) hours as needed for nausea or vomiting. 09/24/14   Juliet RudeNathan R. Pickering, MD   BP 133/67  Pulse 90  Temp(Src) 97.8 F (36.6 C)  Resp 18  SpO2 96% Physical Exam  Nursing note and vitals reviewed. Constitutional: He appears well-developed and well-nourished. No distress.  HENT:  Head: Normocephalic and atraumatic.  Right Ear: External ear normal.  Left Ear: External ear normal.  Eyes: Conjunctivae are normal. Right eye exhibits no discharge. Left eye exhibits no discharge. No scleral icterus.  Neck: Neck supple. No tracheal deviation present.  Cardiovascular: Normal rate.   Pulmonary/Chest: Effort normal. No stridor. No respiratory distress.  Musculoskeletal: He exhibits no edema.  Approximately one 2 cm area  of induration and erythema in the left antecubital fossa,  Neurological: He is alert. Cranial nerve deficit: no gross deficits.  Skin: Skin is warm and dry. No rash noted.  Psychiatric: He has a normal mood and affect.    ED Course  INCISION AND DRAINAGE Date/Time: 09/28/2014 5:07 AM Performed by: Linwood DibblesKNAPP, Mishael Haran Authorized by: Linwood DibblesKNAPP, Amri Lien Consent: Verbal consent obtained. Risks and benefits: risks, benefits and alternatives were discussed Consent given by: patient Type: abscess Body area: upper extremity (Left antecubital fossa) Anesthesia: local infiltration Local anesthetic:  lidocaine 1% with epinephrine Anesthetic total: 3 ml Patient sedated: no Risk factor: underlying major vessel and  underlying major nerve Scalpel size: 11 Incision type: single straight Complexity: complex Drainage: purulent Drainage amount: moderate Packing material: 1/4 in iodoform gauze Patient tolerance: Patient tolerated the procedure well with no immediate complications.   (including critical care time) Labs Review Labs Reviewed - No data to display  Imaging Review No results found.   EKG Interpretation None      MDM   Final diagnoses:  Abscess    Suspicious for abscess associated with IVDA.  Tolerated I&D.  Will dc home with abx and pain meds.  Recheck in 2 days    Linwood DibblesJon Akhil Piscopo, MD 09/28/14 [phone]

## 2014-12-23 ENCOUNTER — Encounter (HOSPITAL_COMMUNITY): Payer: Self-pay | Admitting: Emergency Medicine

## 2014-12-23 ENCOUNTER — Emergency Department (HOSPITAL_COMMUNITY)
Admission: EM | Admit: 2014-12-23 | Discharge: 2014-12-23 | Disposition: A | Discharge status: Home / Self Care | Payer: Medicare Other | Attending: Emergency Medicine | Admitting: Emergency Medicine

## 2014-12-23 DIAGNOSIS — Z79899 Other long term (current) drug therapy: Secondary | ICD-10-CM | POA: Insufficient documentation

## 2014-12-23 DIAGNOSIS — R197 Diarrhea, unspecified: Secondary | ICD-10-CM | POA: Diagnosis not present

## 2014-12-23 DIAGNOSIS — R110 Nausea: Secondary | ICD-10-CM | POA: Diagnosis not present

## 2014-12-23 DIAGNOSIS — Z720 Tobacco use: Secondary | ICD-10-CM | POA: Insufficient documentation

## 2014-12-23 DIAGNOSIS — Z791 Long term (current) use of non-steroidal anti-inflammatories (NSAID): Secondary | ICD-10-CM | POA: Insufficient documentation

## 2014-12-23 DIAGNOSIS — R000 Tachycardia, unspecified: Secondary | ICD-10-CM | POA: Diagnosis not present

## 2014-12-23 DIAGNOSIS — F1110 Opioid abuse, uncomplicated: Secondary | ICD-10-CM | POA: Insufficient documentation

## 2014-12-23 DIAGNOSIS — G8929 Other chronic pain: Secondary | ICD-10-CM | POA: Insufficient documentation

## 2014-12-23 MED ORDER — LOPERAMIDE HCL 2 MG PO CAPS: 2.0000 mg | ORAL_CAPSULE | ORAL | Status: DC | PRN

## 2014-12-23 MED ORDER — ONDANSETRON HCL 4 MG PO TABS: 4.0000 mg | ORAL_TABLET | Freq: Four times a day (QID) | ORAL | Status: DC

## 2014-12-23 MED ORDER — DICYCLOMINE HCL 20 MG PO TABS: 20.0000 mg | ORAL_TABLET | Freq: Two times a day (BID) | ORAL | Status: DC

## 2014-12-23 NOTE — ED Provider Notes (Signed)
CSN: [id number]     Arrival date & time 12/23/14  2140 History   First MD Initiated Contact with Patient 12/23/14 2155     Chief Complaint  Patient presents with  . Drug Problem    The patient needs help detoxing from heroin.  He says the last time he did heroin was thirty minutes ago.       (Consider location/radiation/quality/duration/timing/severity/associated sxs/prior Treatment) HPI ARMSTEAD Greg Velasquez is a 24 y.o. male with a history of polysubstance abuse comes in for help detoxing from heroin. Patient states last time he did heroin was 45 minutes ago and he is ready to stop now. Requests referral and outpatient resources for detox facility. Also requests medication for nausea and diarrhea as these are his primary symptoms when he is withdrawing. He denies any other medical problems at this time. Denies fevers, chills, abdominal pain or chest pain, shortness of breath, numbness or weakness. No other modifying factors.  Past Medical History  Diagnosis Date  . Chronic back pain greater than 3 months duration    Past Surgical History  Procedure Laterality Date  . Wrist fracture surgery     History reviewed. No pertinent family history. History  Substance Use Topics  . Smoking status: Current Every Day Smoker -- 1.00 packs/day    Types: Cigarettes  . Smokeless tobacco: Not on file  . Alcohol Use: No    Review of Systems  Constitutional: Negative for fever.  HENT: Negative for sore throat.   Eyes: Negative for visual disturbance.  Respiratory: Negative for shortness of breath.   Cardiovascular: Negative for chest pain.  Gastrointestinal: Negative for abdominal pain.  Endocrine: Negative for polyuria.  Genitourinary: Negative for dysuria.  Skin: Negative for rash.  Neurological: Negative for headaches.      Allergies  Vicodin  Home Medications   Prior to Admission medications   Medication Sig Start Date End Date Taking? Authorizing Provider  cloNIDine (CATAPRES) 0.1 MG  tablet 1 tab po bid in morning and evening for 3 doses, then in morning for 2 doses. 09/24/14   Juliet Rude. Rubin Payor, MD  dicyclomine (BENTYL) 20 MG tablet Take 1 tablet (20 mg total) by mouth every 6 (six) hours as needed for spasms (abdominal cramping). 09/24/14   Juliet Rude. Pickering, MD  dicyclomine (BENTYL) 20 MG tablet Take 1 tablet (20 mg total) by mouth 2 (two) times daily. 12/23/14   Sharlene Motts, PA-C  doxycycline (VIBRAMYCIN) 100 MG capsule Take 1 capsule (100 mg total) by mouth 2 (two) times daily. 09/28/14   Linwood Dibbles, MD  hydrOXYzine (ATARAX/VISTARIL) 25 MG tablet Take 1 tablet (25 mg total) by mouth every 6 (six) hours as needed for anxiety. 09/24/14   Juliet Rude. Rubin Payor, MD  loperamide (IMODIUM) 2 MG capsule Take 1-2 capsules (2-4 mg total) by mouth 2 (two) times daily as needed for diarrhea or loose stools (diarrhea). 09/24/14   Juliet Rude. Pickering, MD  loperamide (IMODIUM) 2 MG capsule Take 1 capsule (2 mg total) by mouth as needed for diarrhea or loose stools. 12/23/14   Earle Gell Jelisa [place], PA-C  methocarbamol (ROBAXIN) 500 MG tablet Take 1 tablet (500 mg total) by mouth every 8 (eight) hours as needed for muscle spasms. 09/24/14   Juliet Rude. Rubin Payor, MD  naproxen (NAPROSYN) 500 MG tablet Take 1 tablet (500 mg total) by mouth 2 (two) times daily as needed (aching, pain, or discomfort). 09/24/14   Juliet Rude. Pickering, MD  naproxen (NAPROSYN) 500 MG tablet Take 1  tablet (500 mg total) by mouth 2 (two) times daily. 09/28/14   Linwood DibblesJon Knapp, MD  ondansetron (ZOFRAN) 4 MG tablet Take 1 tablet (4 mg total) by mouth every 6 (six) hours. 12/23/14   Earle GellBenjamin W Nasreen Goedecke, PA-C  ondansetron (ZOFRAN-ODT) 4 MG disintegrating tablet Take 1 tablet (4 mg total) by mouth every 6 (six) hours as needed for nausea or vomiting. 09/24/14   Juliet RudeNathan R. Pickering, MD   BP 128/77 mmHg  Pulse 105  Temp(Src) 98.2 F (36.8 C) (Oral)  Resp 18  SpO2 97% Physical Exam  Constitutional: He is oriented to person,  place, and time. He appears well-developed and well-nourished. No distress.  HENT:  Head: Normocephalic and atraumatic.  Mouth/Throat: No oropharyngeal exudate.  Eyes: Conjunctivae and EOM are normal. Pupils are equal, round, and reactive to light. Right eye exhibits no discharge. Left eye exhibits no discharge. No scleral icterus.  Neck: Normal range of motion. No tracheal deviation present.  Cardiovascular: Regular rhythm and normal heart sounds.   Mildly tachycardic  Pulmonary/Chest: Effort normal and breath sounds normal. No stridor. No respiratory distress. He has no wheezes. He has no rales.  Abdominal: Soft. He exhibits no distension. There is no tenderness.  Musculoskeletal: Normal range of motion. He exhibits no edema or tenderness.  Neurological: He is alert and oriented to person, place, and time. No cranial nerve deficit.  Skin: Skin is warm and dry. No rash noted. He is not diaphoretic.  Psychiatric: He has a normal mood and affect.    ED Course  Procedures (including critical care time) Labs Review Labs Reviewed - No data to display  Imaging Review No results found.   EKG Interpretation None     Meds given in ED:  Medications - No data to display  Discharge Medication List as of 12/23/2014 10:32 PM    START taking these medications   Details  !! dicyclomine (BENTYL) 20 MG tablet Take 1 tablet (20 mg total) by mouth 2 (two) times daily., Starting 12/23/2014, Until Discontinued, Print    !! loperamide (IMODIUM) 2 MG capsule Take 1 capsule (2 mg total) by mouth as needed for diarrhea or loose stools., Starting 12/23/2014, Until Discontinued, Print    ondansetron (ZOFRAN) 4 MG tablet Take 1 tablet (4 mg total) by mouth every 6 (six) hours., Starting 12/23/2014, Until Discontinued, Print     !! - Potential duplicate medications found. Please discuss with provider.     Filed Vitals:   12/23/14 2143  BP: 128/77  Pulse: 105  Temp: 98.2 F (36.8 C)  TempSrc: Oral   Resp: 18  SpO2: 97%    MDM  Vitals stable  -afebrile, Mild tachycardia likely due to recent injection of heroin. Pt resting comfortably in ED. Denies any other medical problems at this time. PE--not concerning further acute or emergent pathology at this time. Patient appears well and appropriate for discharge.  Will DC with Zofran, Bentyl and loperamide for withdrawal symptoms. Given outpatient resource guide Discussed f/u with PCP and return precautions, pt very amenable to plan. Patient stable, in good condition and is appropriate for discharge  Final diagnoses:  Heroin abuse        Sharlene MottsBenjamin W Laquandra Carrillo, PA-C 12/24/14 1154  Audree CamelScott T Goldston, MD 12/27/14 [phone]

## 2014-12-23 NOTE — ED Notes (Signed)
The patient needs help detoxing from heroin.  He says the last time he did heroin was thirty minutes ago.  The patient denies any other symptoms other than wanting help detoxing from heroin.

## 2014-12-23 NOTE — ED Notes (Signed)
Unable to locate patient x 2 for discharge

## 2014-12-23 NOTE — Discharge Instructions (Signed)
Opioid Withdrawal Opioids are a group of narcotic drugs. They include the street drug heroin. They also include pain medicines, such as morphine, hydrocodone, oxycodone, and fentanyl. Opioid withdrawal is a group of characteristic physical and mental signs and symptoms. It typically occurs if you have been using opioids daily for several weeks or longer and stop using or rapidly decrease use. Opioid withdrawal can also occur if you have used opioids daily for a long time and are given a medicine to block the effect.  SIGNS AND SYMPTOMS Opioid withdrawal includes three or more of the following symptoms:   Depressed, anxious, or irritable mood.  Nausea or vomiting.  Muscle aches or spasms.   Watery eyes.   Runny nose.  Dilated pupils, sweating, or hairs standing on end.  Diarrhea or intestinal cramping.  Yawning.   Fever.  Increased blood pressure.  Fast pulse.  Restlessness or trouble sleeping. These signs and symptoms occur within several hours of stopping or reducing short-acting opioids, such as heroin. They can occur within 3 days of stopping or reducing long-acting opioids, such as methadone. Withdrawal begins within minutes of receiving a drug that blocks the effects of opioids, such as naltrexone or naloxone. DIAGNOSIS  Opioid use disorder is diagnosed by your health care provider. You will be asked about your symptoms, drug and alcohol use, medical history, and use of medicines. A physical exam may be done. Lab tests may be ordered. Your health care provider may have you see a mental health professional.  TREATMENT  The treatment for opioid withdrawal is usually provided by medical doctors with special training in substance use disorders (addiction specialists). The following medicines may be included in treatment:  Opioids given in place of the abused opioid. They turn on opioid receptors in the brain and lessen or prevent withdrawal symptoms. They are gradually  decreased (opioid substitution and taper).  Non-opioids that can lessen certain opioid withdrawal symptoms. They may be used alone or with opioid substitution and taper. Successful long-term recovery usually requires medicine, counseling, and group support. HOME CARE INSTRUCTIONS   Take medicines only as directed by your health care provider.  Check with your health care provider before starting new medicines.  Keep all follow-up visits as directed by your health care provider. SEEK MEDICAL CARE IF:  You are not able to take your medicines as directed.  Your symptoms get worse.  You relapse. SEEK IMMEDIATE MEDICAL CARE IF:  You have serious thoughts about hurting yourself or others.  You have a seizure.  You lose consciousness. Document Released: 12/02/2003 Document Revised: 04/15/2014 Document Reviewed: 12/12/2013 [facility] Patient Information 2015 [place], [place]. This information is not intended to replace advice given to you by your health care provider. Make sure you discuss any questions you have with your health care provider.   It is important for you to follow up with any of the outpatient treatment programs for further evaluation and treatment for our heroin addiction. Please take your medicines as prescribed for symptoms associated with your withdrawal. Return to ED for worsening symptoms.

## 2014-12-23 NOTE — ED Notes (Signed)
Unable to locate pt for discharge.

## 2015-08-13 ENCOUNTER — Encounter (HOSPITAL_COMMUNITY): Payer: Self-pay | Admitting: *Deleted

## 2015-08-13 ENCOUNTER — Emergency Department (HOSPITAL_COMMUNITY)
Admission: EM | Admit: 2015-08-13 | Discharge: 2015-08-13 | Disposition: A | Discharge status: Home / Self Care | Payer: Medicare Other | Attending: Emergency Medicine | Admitting: Emergency Medicine

## 2015-08-13 DIAGNOSIS — G8929 Other chronic pain: Secondary | ICD-10-CM | POA: Diagnosis not present

## 2015-08-13 DIAGNOSIS — F1120 Opioid dependence, uncomplicated: Secondary | ICD-10-CM

## 2015-08-13 DIAGNOSIS — M79602 Pain in left arm: Secondary | ICD-10-CM | POA: Diagnosis present

## 2015-08-13 DIAGNOSIS — Z720 Tobacco use: Secondary | ICD-10-CM | POA: Insufficient documentation

## 2015-08-13 DIAGNOSIS — F1990 Other psychoactive substance use, unspecified, uncomplicated: Secondary | ICD-10-CM

## 2015-08-13 DIAGNOSIS — F1910 Other psychoactive substance abuse, uncomplicated: Secondary | ICD-10-CM

## 2015-08-13 DIAGNOSIS — R079 Chest pain, unspecified: Secondary | ICD-10-CM | POA: Diagnosis not present

## 2015-08-13 HISTORY — DX: Palpitations: R00.2

## 2015-08-13 NOTE — ED Provider Notes (Signed)
CSN: [id number]     Arrival date & time 08/13/15  0231 History   First MD Initiated Contact with Patient 08/13/15 0249     Chief Complaint  Patient presents with  . Arm Pain     (Consider location/radiation/quality/duration/timing/severity/associated sxs/prior Treatment) Patient is a 24 y.o. male presenting with arm pain. The history is provided by the patient. No language interpreter was used.  Arm Pain This is a new problem. The current episode started today. Pertinent negatives include no chills or fever. Associated symptoms comments: Onset of pain and swelling of left hand immediately after injecting heroin earlier tonight. No history of similar symptoms. He reports there was no effect of the shot as if he didn't get any of the drug into his system. The swelling is improving over time. .    Past Medical History  Diagnosis Date  . Chronic back pain greater than 3 months duration   . Heart palpitations    Past Surgical History  Procedure Laterality Date  . Wrist fracture surgery     History reviewed. No pertinent family history. Social History  Substance Use Topics  . Smoking status: Current Every Day Smoker -- 2.00 packs/day    Types: Cigarettes  . Smokeless tobacco: None  . Alcohol Use: No    Review of Systems  Constitutional: Negative for fever and chills.  HENT: Negative.   Respiratory: Negative.   Cardiovascular: Negative.   Gastrointestinal: Negative.   Musculoskeletal:       See HPI.  Skin: Negative.  Negative for color change and wound.  Neurological: Negative.       Allergies  Vicodin  Home Medications   Prior to Admission medications   Medication Sig Start Date End Date Taking? Authorizing Provider  acetaminophen (TYLENOL) 500 MG tablet Take 1,000 mg by mouth every 6 (six) hours as needed for mild pain.   Yes Historical Provider, MD  cloNIDine (CATAPRES) 0.1 MG tablet 1 tab po bid in morning and evening for 3 doses, then in morning for 2  doses. Patient not taking: Reported on 08/13/2015 09/24/14   Benjiman Core, MD  dicyclomine (BENTYL) 20 MG tablet Take 1 tablet (20 mg total) by mouth every 6 (six) hours as needed for spasms (abdominal cramping). Patient not taking: Reported on 08/13/2015 09/24/14   Benjiman Core, MD  dicyclomine (BENTYL) 20 MG tablet Take 1 tablet (20 mg total) by mouth 2 (two) times daily. Patient not taking: Reported on 08/13/2015 12/23/14   Joycie Peek, PA-C  doxycycline (VIBRAMYCIN) 100 MG capsule Take 1 capsule (100 mg total) by mouth 2 (two) times daily. Patient not taking: Reported on 08/13/2015 09/28/14   Linwood Dibbles, MD  hydrOXYzine (ATARAX/VISTARIL) 25 MG tablet Take 1 tablet (25 mg total) by mouth every 6 (six) hours as needed for anxiety. Patient not taking: Reported on 08/13/2015 09/24/14   Benjiman Core, MD  loperamide (IMODIUM) 2 MG capsule Take 1-2 capsules (2-4 mg total) by mouth 2 (two) times daily as needed for diarrhea or loose stools (diarrhea). Patient not taking: Reported on 08/13/2015 09/24/14   Benjiman Core, MD  loperamide (IMODIUM) 2 MG capsule Take 1 capsule (2 mg total) by mouth as needed for diarrhea or loose stools. Patient not taking: Reported on 08/13/2015 12/23/14   Joycie Peek, PA-C  methocarbamol (ROBAXIN) 500 MG tablet Take 1 tablet (500 mg total) by mouth every 8 (eight) hours as needed for muscle spasms. Patient not taking: Reported on 08/13/2015 09/24/14   Benjiman Core, MD  naproxen (  NAPROSYN) 500 MG tablet Take 1 tablet (500 mg total) by mouth 2 (two) times daily as needed (aching, pain, or discomfort). Patient not taking: Reported on 08/13/2015 09/24/14   Benjiman Core, MD  naproxen (NAPROSYN) 500 MG tablet Take 1 tablet (500 mg total) by mouth 2 (two) times daily. Patient not taking: Reported on 08/13/2015 09/28/14   Linwood Dibbles, MD  ondansetron (ZOFRAN) 4 MG tablet Take 1 tablet (4 mg total) by mouth every 6 (six) hours. Patient not taking: Reported on  08/13/2015 12/23/14   Joycie Peek, PA-C  ondansetron (ZOFRAN-ODT) 4 MG disintegrating tablet Take 1 tablet (4 mg total) by mouth every 6 (six) hours as needed for nausea or vomiting. Patient not taking: Reported on 08/13/2015 09/24/14   Benjiman Core, MD   BP 134/75 mmHg  Pulse 95  Temp(Src) 98 F (36.7 C) (Oral)  Resp 20  SpO2 100% Physical Exam  Constitutional: He is oriented to person, place, and time. He appears well-developed and well-nourished.  Neck: Normal range of motion.  Pulmonary/Chest: Effort normal.  Musculoskeletal: Normal range of motion.  Left hand moderately swollen without redness. Injection sites unremarkable. ROM of fingers limited by swelling. No induration or mass. Forearm is non-tender and without swelling.  Neurological: He is alert and oriented to person, place, and time.  Skin: Skin is warm and dry.  Psychiatric: He has a normal mood and affect.    ED Course  Procedures (including critical care time) Labs Review Labs Reviewed - No data to display  Imaging Review No results found. I have personally reviewed and evaluated these images and lab results as part of my medical decision-making.   EKG Interpretation None      MDM   Final diagnoses:  None    1. Injection injury 2. Heroin dependence 3. Polysubstance abuse  Hand swelling is improving over time. No redness or induration. Expect continued improvement.     Elpidio Anis, PA-C 08/13/15 1610  Azalia Bilis, MD 08/13/15 [phone]

## 2015-08-13 NOTE — ED Notes (Signed)
Pt arrived via EMS accompanied by [employer] and S.O, with c/o swelling in left arm after shooting heroin tonight. He states there has been no change in heroin supply since using earlier today.  Per pt, he used earlier today, same supply he used tonight, but had burning in arm and hands and now arm is swollen. He also has joint pain. He also used cocaine. VS: BP: 120/78 HR: 90's SpO2 97%RA

## 2015-08-13 NOTE — ED Notes (Signed)
Bed: WA20 Expected date:  Expected time:  Means of arrival:  Comments: EMS 

## 2015-08-13 NOTE — Discharge Instructions (Signed)
Finding Treatment for Alcohol and Drug Addiction It can be hard to find the right place to get professional treatment. Here are some important things to consider:  There are different types of treatment to choose from.  Some programs are live-in (residential) while others are not (outpatient). Sometimes a combination is offered.  No single type of program is right for everyone.  Most treatment programs involve a combination of education, counseling, and a 12-step, spiritually-based approach.  There are non-spiritually based programs (not 12-step).  Some treatment programs are government sponsored. They are geared for patients without private insurance.  Treatment programs can vary in many respects such as:  Cost and types of insurance accepted.  Types of on-site medical services offered.  Length of stay, setting, and size.  Overall philosophy of treatment. A person may need specialized treatment or have needs not addressed by all programs. For example, adolescents need treatment appropriate for their age. Other people have secondary disorders that must be managed as well. Secondary conditions can include mental illness, such as depression or diabetes. Often, a period of detoxification from alcohol or drugs is needed. This requires medical supervision and not all programs offer this. THINGS TO CONSIDER WHEN SELECTING A TREATMENT PROGRAM   Is the program certified by the appropriate government agency? Even private programs must be certified and employ certified professionals.  Does the program accept your insurance? If not, can a payment plan be set up?  Is the facility clean, organized, and well run? Do they allow you to speak with graduates who can share their treatment experience with you? Can you tour the facility? Can you meet with staff?  Does the program meet the full range of individual needs?  Does the treatment program address sexual orientation and physical disabilities?  Do they provide age, gender, and culturally appropriate treatment services?  Is treatment available in languages other than English?  Is long-term aftercare support or guidance encouraged and provided?  Is assessment of an individual's treatment plan ongoing to ensure it meets changing needs?  Does the program use strategies to encourage reluctant patients to remain in treatment long enough to increase the likelihood of success?  Does the program offer counseling (individual or group) and other behavioral therapies?  Does the program offer medicine as part of the treatment regimen, if needed?  Is there ongoing monitoring of possible relapse? Is there a defined relapse prevention program? Are services or referrals offered to family members to ensure they understand addiction and the recovery process? This would help them support the recovering individual.  Are 12-step meetings held at the center or is transport available for patients to attend outside meetings? In countries outside of the [place]. and [place], Magazine features editor for contact information for services in your area. Document Released: 10/28/2005 Document Revised: 02/21/2012 Document Reviewed: 05/09/2008 [facility] Patient Information 2015 [place], [place]. This information is not intended to replace advice given to you by your health care provider. Make sure you discuss any questions you have with your health care provider. Cryotherapy Cryotherapy means treatment with cold. Ice or gel packs can be used to reduce both pain and swelling. Ice is the most helpful within the first 24 to 48 hours after an injury or flare-up from overusing a muscle or joint. Sprains, strains, spasms, burning pain, shooting pain, and aches can all be eased with ice. Ice can also be used when recovering from surgery. Ice is effective, has very few side effects, and is safe for most people  to use. PRECAUTIONS  Ice is not a safe treatment option for people  with:  Raynaud phenomenon. This is a condition affecting small blood vessels in the extremities. Exposure to cold may cause your problems to return.  Cold hypersensitivity. There are many forms of cold hypersensitivity, including:  Cold urticaria. Red, itchy hives appear on the skin when the tissues begin to warm after being iced.  Cold erythema. This is a red, itchy rash caused by exposure to cold.  Cold hemoglobinuria. Red blood cells break down when the tissues begin to warm after being iced. The hemoglobin that carry oxygen are passed into the urine because they cannot combine with blood proteins fast enough.  Numbness or altered sensitivity in the area being iced. If you have any of the following conditions, do not use ice until you have discussed cryotherapy with your caregiver:  Heart conditions, such as arrhythmia, angina, or chronic heart disease.  High blood pressure.  Healing wounds or open skin in the area being iced.  Current infections.  Rheumatoid arthritis.  Poor circulation.  Diabetes. Ice slows the blood flow in the region it is applied. This is beneficial when trying to stop inflamed tissues from spreading irritating chemicals to surrounding tissues. However, if you expose your skin to cold temperatures for too long or without the proper protection, you can damage your skin or nerves. Watch for signs of skin damage due to cold. HOME CARE INSTRUCTIONS Follow these tips to use ice and cold packs safely.  Place a dry or damp towel between the ice and skin. A damp towel will cool the skin more quickly, so you may need to shorten the time that the ice is used.  For a more rapid response, add gentle compression to the ice.  Ice for no more than 10 to 20 minutes at a time. The bonier the area you are icing, the less time it will take to get the benefits of ice.  Check your skin after 5 minutes to make sure there are no signs of a poor response to cold or skin  damage.  Rest 20 minutes or more between uses.  Once your skin is numb, you can end your treatment. You can test numbness by very lightly touching your skin. The touch should be so light that you do not see the skin dimple from the pressure of your fingertip. When using ice, most people will feel these normal sensations in this order: cold, burning, aching, and numbness.  Do not use ice on someone who cannot communicate their responses to pain, such as small children or people with dementia. HOW TO MAKE AN ICE PACK Ice packs are the most common way to use ice therapy. Other methods include ice massage, ice baths, and cryosprays. Muscle creams that cause a cold, tingly feeling do not offer the same benefits that ice offers and should not be used as a substitute unless recommended by your caregiver. To make an ice pack, do one of the following:  Place crushed ice or a bag of frozen vegetables in a sealable plastic bag. Squeeze out the excess air. Place this bag inside another plastic bag. Slide the bag into a pillowcase or place a damp towel between your skin and the bag.  Mix 3 parts water with 1 part rubbing alcohol. Freeze the mixture in a sealable plastic bag. When you remove the mixture from the freezer, it will be slushy. Squeeze out the excess air. Place this bag inside another plastic  bag. Slide the bag into a pillowcase or place a damp towel between your skin and the bag. SEEK MEDICAL CARE IF:  You develop white spots on your skin. This may give the skin a blotchy (mottled) appearance.  Your skin turns blue or pale.  Your skin becomes waxy or hard.  Your swelling gets worse. MAKE SURE YOU:   Understand these instructions.  Will watch your condition.  Will get help right away if you are not doing well or get worse. Document Released: 07/26/2011 Document Revised: 04/15/2014 Document Reviewed: 07/26/2011 [facility] Patient Information 2015 [place], [place]. This information is not  intended to replace advice given to you by your health care provider. Make sure you discuss any questions you have with your health care provider.

## 2015-12-01 ENCOUNTER — Emergency Department (HOSPITAL_COMMUNITY): Payer: Medicare Other

## 2015-12-01 ENCOUNTER — Emergency Department (HOSPITAL_COMMUNITY)
Admission: EM | Admit: 2015-12-01 | Discharge: 2015-12-01 | Disposition: A | Discharge status: Home / Self Care | Payer: Medicare Other | Attending: Emergency Medicine | Admitting: Emergency Medicine

## 2015-12-01 ENCOUNTER — Encounter (HOSPITAL_COMMUNITY): Payer: Self-pay | Admitting: Cardiology

## 2015-12-01 DIAGNOSIS — S025XXA Fracture of tooth (traumatic), initial encounter for closed fracture: Secondary | ICD-10-CM | POA: Insufficient documentation

## 2015-12-01 DIAGNOSIS — Y92149 Unspecified place in prison as the place of occurrence of the external cause: Secondary | ICD-10-CM | POA: Insufficient documentation

## 2015-12-01 DIAGNOSIS — S0083XA Contusion of other part of head, initial encounter: Secondary | ICD-10-CM | POA: Diagnosis not present

## 2015-12-01 DIAGNOSIS — G8929 Other chronic pain: Secondary | ICD-10-CM | POA: Insufficient documentation

## 2015-12-01 DIAGNOSIS — K0889 Other specified disorders of teeth and supporting structures: Secondary | ICD-10-CM | POA: Diagnosis not present

## 2015-12-01 DIAGNOSIS — Y999 Unspecified external cause status: Secondary | ICD-10-CM | POA: Diagnosis not present

## 2015-12-01 DIAGNOSIS — F17210 Nicotine dependence, cigarettes, uncomplicated: Secondary | ICD-10-CM | POA: Insufficient documentation

## 2015-12-01 DIAGNOSIS — Y9389 Activity, other specified: Secondary | ICD-10-CM | POA: Diagnosis not present

## 2015-12-01 DIAGNOSIS — R220 Localized swelling, mass and lump, head: Secondary | ICD-10-CM | POA: Diagnosis not present

## 2015-12-01 DIAGNOSIS — S0990XA Unspecified injury of head, initial encounter: Secondary | ICD-10-CM | POA: Diagnosis present

## 2015-12-01 MED ORDER — NAPROXEN 500 MG PO TABS: 500.0000 mg | ORAL_TABLET | Freq: Two times a day (BID) | ORAL | Status: DC | PRN

## 2015-12-01 MED ORDER — METHOCARBAMOL 500 MG PO TABS: 500.0000 mg | ORAL_TABLET | Freq: Four times a day (QID) | ORAL | Status: DC | PRN

## 2015-12-01 MED ORDER — IBUPROFEN 400 MG PO TABS
800.0000 mg | ORAL_TABLET | Freq: Once | ORAL | Status: AC
  Administered 2015-12-01: 800 mg via ORAL
  Filled 2015-12-01: qty 2
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
## 2015-12-01 NOTE — Discharge Instructions (Signed)
Read the information below.  Use the prescribed medication as directed.  Please discuss all new medications with your pharmacist.  You may return to the Emergency Department at any time for worsening condition or any new symptoms that concern you.  Please call the dentist listed above within 48 hours to schedule a close follow up appointment.  If you develop fevers, swelling in your face, difficulty swallowing or breathing, return to the ER immediately for a recheck.     Facial or Scalp Contusion  A facial or scalp contusion is a deep bruise on the face or head. Contusions happen when an injury causes bleeding under the skin. Signs of bruising include pain, puffiness (swelling), and discolored skin. The contusion may turn blue, purple, or yellow. HOME CARE  Only take medicines as told by your doctor.  Put ice on the injured area.  Put ice in a plastic bag.  Place a towel between your skin and the bag.  Leave the ice on for 20 minutes, 2-3 times a day. GET HELP IF:  You have bite problems.  You have pain when chewing.  You are worried about your face not healing normally. GET HELP RIGHT AWAY IF:   You have severe pain or a headache and medicine does not help.  You are very tired or confused, or your personality changes.  You throw up (vomit).  You have a nosebleed that will not stop.  You see two of everything (double vision) or have blurry vision.  You have fluid coming from your nose or ear.  You have problems walking or using your arms or legs. MAKE SURE YOU:   Understand these instructions.  Will watch your condition.  Will get help right away if you are not doing well or get worse.   This information is not intended to replace advice given to you by your health care provider. Make sure you discuss any questions you have with your health care provider.   Document Released: 11/18/2011 Document Revised: 12/20/2014 Document Reviewed: 07/12/2013 Elsevier Interactive  Patient Education 2016 [employer]    Emergency Department Resource Guide 1) Find a Doctor and Pay Out of Pocket Although you won't have to find out who is covered by your insurance plan, it is a good idea to ask around and get recommendations. You will then need to call the office and see if the doctor you have chosen will accept you as a new patient and what types of options they offer for patients who are self-pay. Some doctors offer discounts or will set up payment plans for their patients who do not have insurance, but you will need to ask so you aren't surprised when you get to your appointment.  2) Contact Your Local Health Department Not all health departments have doctors that can see patients for sick visits, but many do, so it is worth a call to see if yours does. If you don't know where your local health department is, you can check in your phone book. The CDC also has a tool to help you locate your state's health department, and many state websites also have listings of all of their local health departments.  3) Find a Walk-in Clinic If your illness is not likely to be very severe or complicated, you may want to try a walk in clinic. These are popping up all over the country in pharmacies, drugstores, and shopping centers. They're usually staffed by nurse practitioners or physician assistants that have been trained to  treat common illnesses and complaints. They're usually fairly quick and inexpensive. However, if you have serious medical issues or chronic medical problems, these are probably not your best option.  No Primary Care Doctor: - Call Health Connect at  [phone] - they can help you locate a primary care doctor that  accepts your insurance, provides certain services, etc. - Physician Referral Service- [phone]  Chronic Pain Problems: Organization         Address  Phone   Notes  [facility]  [phone] Patients need to be referred by their  primary care doctor.   Medication Assistance: Organization         Address  Phone   Notes  [facility] Medication [facility] [address] [place] [place], [place] [address] [phone] --Must be a resident of [facility] -- Must have NO insurance coverage whatsoever (no Medicaid/ Medicare, etc.) -- The pt. MUST have a primary care doctor that directs their care regularly and follows them in the community   [facility]ist  [phone]   [employer]  [phone]    Agencies that provide inexpensive medical care: Organization         Address  Phone   Notes  [facility]  [phone]   [facility]    [phone]   [facility] [address] [place], [place] [address] [phone]   [facility] [address], [place] [phone]   Planned Parenthood    [phone]   [facility]    [phone]   [facility]  [address], [place] Phone:  [phone], Fax:  [phone] Hours of Operation:  9 am - 6 pm, M-F.  Also accepts Medicaid/Medicare and self-pay.  [facility]  [address], [place] Phone: [phone], Fax: [phone]. Hours of Operation:  8:30 am - 5:30 pm, M-F.  Also accepts Medicaid and self-pay.  [facility] [place] [address], [place] Phone: [phone]   [facility] [address] Natasha Bence [place], [place] [phone], Ext. [phone] Mondays & Thursdays: 7-9 AM.  First 15 patients are seen on a first come, first serve basis.    Medicaid-accepting [facility] Providers:  Organization         Address  Phone   Notes  [facility] [address], [place] [phone] Also accepts self-pay patients.  [facility] [address] Laurell Josephs [place], [place]  [phone]   [facility] [address], [place] [phone]   [facility] [address], [place] [phone]   Renaye Rakers [address], [place]   [phone] Only accepts [place] Access [place] patients after they have their name applied to their card.   Self-Pay (no insurance) in [facility]:  Organization         Address  Phone   Notes  Sickle Cell Patients, [facility] [address] [place], [place] [phone]   [facility] [address] [place], [place] [phone]   [facility] [place]  [address], [place] [phone]   [facility]/Dr. Osei-Bonsu  [address], [place] or [address], [place] [phone] Phone number for both [facility] and [place] locations is the same.  Urgent Medical and [facility] [address], [place] [phone]   [facility] [address], [place] or [address] [phone]   [facility] [address], [place] [phone], phone; [phone], fax Sees patients 1st and 3rd Saturday of every month.  Must not qualify for public or private insurance (i.e. Medicaid, Medicare, [place] Health Choice, Veterans' Benefits)  Household income should be no more than 200% of the poverty level The clinic cannot treat you if you are pregnant or think you are pregnant  Sexually transmitted diseases are not treated at the clinic.    Dental Care: Organization         Address  Phone  Notes  [facility] [address] [place], [place] [phone] Accepts children up to age 7 who are enrolled in [place] or [place] Health Choice; pregnant women with a Medicaid card; and children who have applied for Medicaid or [place] Health Choice, but were declined, whose parents can pay a reduced fee  at time of service.  [facility]  [address], [place] [phone] Accepts children up to age 56 who are enrolled in [place] or [place] Health Choice; pregnant women with a Medicaid card; and children who have applied for Medicaid or [place] Health Choice, but were declined, whose parents can pay a reduced fee at time of service.  [facility]  [address] [place], [place] [phone] Patients are seen by appointment only. Walk-ins are not accepted. [facility] will see patients 37 years of age and older. Monday - Tuesday (8am-5pm) Most Wednesdays (8:30-5pm) $30 per visit, cash only  [facility] Adult [facility]  [address], [facility] [phone] Patients are seen by appointment only. Walk-ins are not accepted. [facility] will see patients 100 years of age and older. One Wednesday Evening (Monthly: Volunteer Based).  $30 per visit, cash only  [employer]  [phone] for adults; Children under age 45, call Graduate Pediatric Dentistry at [phone]. Children aged 21-14, please call [phone] to request a pediatric application.  Dental services are provided in all areas of dental care including fillings, crowns and bridges, complete and partial dentures, implants, gum treatment, root canals, and extractions. Preventive care is also provided. Treatment is provided to both adults and children. Patients are selected via a lottery and there is often a waiting list.   [facility] [address] [place]  [phone] [email or web address]   [facility] [address] [place], [place] [phone], Ext. [phone] Second and Fourth Thursday of each month, opens at 6:30 AM; Clinic ends at 9 AM.  Patients are seen on a first-come first-served basis, and a limited number are seen during each clinic.   [facility]  [address] Ether Griffins [place], [place] [phone]   Eligibility Requirements You must have lived in [place], [place], or [place] counties for at least the last three months.   You cannot be eligible for state or federal sponsored [place], including [employer], [place], or [employer].   You generally cannot be eligible for healthcare insurance through your employer.    How to apply: Eligibility screenings are held every  Tuesday and Wednesday afternoon from 1:00 pm until 4:00 pm. You do not need an appointment for the interview!  [employer] [address] [place], [place] [phone]   [facility] Health Department  [phone]   [facility] Health Department  [phone]   [facility] Health Department  [phone]    Behavioral Health Resources in the Community: Intensive Outpatient Programs Organization         Address  Phone  Notes  [facility] [address], [place], [place] [phone]   [facility] Outpatient [address], [place], [place] [phone]   [facility] [address] [place], [place]  [phone]   [facility] Mental Health [address]  [place], [place] [phone] or [phone]   Substance Abuse Resources Organization         Address  Phone  Notes  [facility]  [phone]   [facility]  [phone]   [facility]  [phone]   Floydene Flock  [phone]   Residential & Outpatient Substance Abuse Program  [phone]   Psychological Services Organization         Address  Phone  Notes  [facility] Behavioral Health  [phone]   [facility] Services  [phone]   [facility] Mental Health [address] [place] [phone] or [phone]    Mobile Crisis Teams Organization         Address  Phone  Notes  [facility], Mobile Crisis Care Unit  [phone]   [facility]  [facility]  [address] [place], [place] [phone]   Doristine Locks [address] [place] [phone]    Self-Help/Support Groups Organization         Address  Phone             Notes  [facility] [place] - variety of support groups  336- I7437963 Call for more information  Narcotics Anonymous (NA), [facility] [address], [employer] [place]  2 meetings at this location   Statistician         Address  Phone  Notes  [facility] [address],    [place]  [phone]   [facility]  [address], [place] [phone], [employer], [place] [phone]   [facility] [address] [place], [place] [phone] Admissions: 8am-3pm M-F  [facility] [address]    [place], [place] [phone]   [facility] [address] [place], [place], [place] [phone]   [facility] [address]  [place], [place] [phone]   [facility] - Intensive Outpatient [address] Laurell Josephs [address], [place], [place] [phone]   [facility] (Addiction Recovery [facility] [address] [place]  [place], [place] [phone] or [phone]   [facility] ([facility]) [address] [place], [place] [phone] Accepts Medicaid  [employer] [address],  [place] [phone] Substance Abuse/Addiction Treatment   [facility] Organization         Address  Phone  Notes  [facility]  [phone]   Angie Fava, PhD [address] Ervin Knack [place], [place]   [phone] or [phone]   [facility] Behavioral   [address] [place], [place] [phone]   [facility] [address] [place], [place] [phone] Insurance/Medicaid/sponsorship through [facility]  [facility] [address]                                    [place], [place] [phone] Therapy/tele-psych/case  [facility] [address]   [place], [place] [phone]    Dr. Lolly Mustache  [phone]   [facility] [place]  [employer] [facility] Dept. 1) [address] [place] 2) [address] [place] 3)  [address], [place] [phone]  [phone]   [facility] Child Abuse Hotline 316 467 0085 or 915-538-2006 (After Hours)
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
## 2016-03-21 ENCOUNTER — Encounter (HOSPITAL_COMMUNITY): Payer: Self-pay | Admitting: Oncology

## 2016-03-21 ENCOUNTER — Emergency Department (HOSPITAL_COMMUNITY)
Admission: EM | Admit: 2016-03-21 | Discharge: 2016-03-21 | Disposition: A | Discharge status: Home / Self Care | Payer: Medicare Other | Attending: Emergency Medicine | Admitting: Emergency Medicine

## 2016-03-21 DIAGNOSIS — G8929 Other chronic pain: Secondary | ICD-10-CM | POA: Insufficient documentation

## 2016-03-21 DIAGNOSIS — B86 Scabies: Secondary | ICD-10-CM

## 2016-03-21 DIAGNOSIS — R21 Rash and other nonspecific skin eruption: Secondary | ICD-10-CM | POA: Diagnosis present

## 2016-03-21 DIAGNOSIS — T7840XA Allergy, unspecified, initial encounter: Secondary | ICD-10-CM | POA: Diagnosis not present

## 2016-03-21 DIAGNOSIS — T07XXXA Unspecified multiple injuries, initial encounter: Secondary | ICD-10-CM

## 2016-03-21 DIAGNOSIS — L981 Factitial dermatitis: Secondary | ICD-10-CM | POA: Insufficient documentation

## 2016-03-21 DIAGNOSIS — F17210 Nicotine dependence, cigarettes, uncomplicated: Secondary | ICD-10-CM | POA: Insufficient documentation

## 2016-03-21 DIAGNOSIS — F424 Excoriation (skin-picking) disorder: Secondary | ICD-10-CM | POA: Diagnosis not present

## 2016-03-21 MED ORDER — PERMETHRIN 5 % EX CREA: TOPICAL_CREAM | CUTANEOUS | Status: AC
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
## 2016-03-21 NOTE — Discharge Instructions (Signed)
1. Medications: Permethrin, usual home medications 2. Treatment: rest, drink plenty of fluids, you may use camomile lotion, try not to scratch 3. Follow Up: Please followup with your primary doctor in 3-5 days for discussion of your diagnoses and further evaluation after today's visit; if you do not have a primary care doctor use the resource guide provided to find one; Please return to the ER for  Worsening symptoms, signs of infection    Scabies, Adult Scabies is a skin condition that happens when very small insects get under the skin (infestation). This causes a rash and severe itchiness. Scabies can spread from person to person (is contagious). If you get scabies, it is common for others in your household to get scabies too. With proper treatment, symptoms usually go away in 2-4 weeks. Scabies usually does not cause lasting problems. CAUSES This condition is caused by mites (Sarcoptes scabiei, or human itch mites) that can only be seen with a microscope. The mites get into the top layer of skin and lay eggs. Scabies can spread from person to person through:  Close contact with a person who has scabies.  Contact with infested items, such as towels, bedding, or clothing. RISK FACTORS This condition is more likely to develop in:  People who live in nursing homes and other extended-care facilities.  People who have sexual contact with a partner who has scabies.  Young children who attend child care facilities.  People who care for others who are at increased risk for scabies. SYMPTOMS Symptoms of this condition may include:  Severe itchiness. This is often worse at night.  A rash that includes tiny red bumps or blisters. The rash commonly occurs on the wrist, elbow, armpit, fingers, waist, groin, or buttocks. Bumps may form a line (burrow) in some areas.  Skin irritation. This can include scaly patches or sores. DIAGNOSIS This condition is diagnosed with a physical exam. Your health  care provider will look closely at your skin. In some cases, your health care provider may take a sample of your affected skin (skin scraping) and have it examined under a microscope. TREATMENT This condition may be treated with:  Medicated cream or lotion that kills the mites. This is spread on the entire body and left on for several hours. Usually, one treatment with medicated cream or lotion is enough to kill all of the mites. In severe cases, the treatment may be repeated.  Medicated cream that relieves itching.  Medicines that help to relieve itching.  Medicines that kill the mites. This treatment is rarely used. HOME CARE INSTRUCTIONS Medicines  Take or apply over-the-counter and prescription medicines as told by your health care provider.  Apply medicated cream or lotion as told by your health care provider.  Do not wash off the medicated cream or lotion until the necessary amount of time has passed. Skin Care  Avoid scratching your affected skin.  Keep your fingernails closely trimmed to reduce injury from scratching.  Take cool baths or apply cool washcloths to help reduce itching. General Instructions  Clean all items that you recently had contact with, including bedding, clothing, and furniture. Do this on the same day that your treatment starts.  Use hot water when you wash items.  Place unwashable items into closed, airtight plastic bags for at least 3 days. The mites cannot live for more than 3 days away from human skin.  Vacuum furniture and mattresses that you use.  Make sure that other people who may have been  infested are examined by a health care provider. These include members of your household and anyone who may have had contact with infested items.  Keep all follow-up visits as told by your health care provider. This is important. SEEK MEDICAL CARE IF:  You have itching that does not go away after 4 weeks of treatment.  You continue to develop new  bumps or burrows.  You have redness, swelling, or pain in your rash area after treatment.  You have fluid, blood, or pus coming from your rash.   This information is not intended to replace advice given to you by your health care provider. Make sure you discuss any questions you have with your health care provider.   Document Released: 08/20/2015 Document Reviewed: 07/01/2015 Elsevier Interactive Patient Education [employer]
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
## 2016-06-27 ENCOUNTER — Emergency Department (HOSPITAL_COMMUNITY): Payer: Medicare Other

## 2016-06-27 ENCOUNTER — Emergency Department (HOSPITAL_COMMUNITY)
Admission: EM | Admit: 2016-06-27 | Discharge: 2016-06-27 | Disposition: A | Discharge status: Home / Self Care | Payer: Medicare Other | Attending: Emergency Medicine | Admitting: Emergency Medicine

## 2016-06-27 ENCOUNTER — Encounter (HOSPITAL_COMMUNITY): Payer: Self-pay

## 2016-06-27 ENCOUNTER — Encounter (HOSPITAL_COMMUNITY): Payer: Self-pay | Admitting: Emergency Medicine

## 2016-06-27 DIAGNOSIS — R079 Chest pain, unspecified: Secondary | ICD-10-CM | POA: Diagnosis not present

## 2016-06-27 DIAGNOSIS — S40812A Abrasion of left upper arm, initial encounter: Secondary | ICD-10-CM | POA: Insufficient documentation

## 2016-06-27 DIAGNOSIS — F17200 Nicotine dependence, unspecified, uncomplicated: Secondary | ICD-10-CM | POA: Insufficient documentation

## 2016-06-27 DIAGNOSIS — Y939 Activity, unspecified: Secondary | ICD-10-CM | POA: Insufficient documentation

## 2016-06-27 DIAGNOSIS — S80811A Abrasion, right lower leg, initial encounter: Secondary | ICD-10-CM | POA: Insufficient documentation

## 2016-06-27 DIAGNOSIS — Y929 Unspecified place or not applicable: Secondary | ICD-10-CM | POA: Diagnosis not present

## 2016-06-27 DIAGNOSIS — R569 Unspecified convulsions: Secondary | ICD-10-CM | POA: Diagnosis not present

## 2016-06-27 DIAGNOSIS — Z5321 Procedure and treatment not carried out due to patient leaving prior to being seen by health care provider: Secondary | ICD-10-CM | POA: Insufficient documentation

## 2016-06-27 DIAGNOSIS — S199XXA Unspecified injury of neck, initial encounter: Secondary | ICD-10-CM | POA: Diagnosis not present

## 2016-06-27 DIAGNOSIS — S40811A Abrasion of right upper arm, initial encounter: Secondary | ICD-10-CM | POA: Diagnosis not present

## 2016-06-27 DIAGNOSIS — S80812A Abrasion, left lower leg, initial encounter: Secondary | ICD-10-CM | POA: Insufficient documentation

## 2016-06-27 DIAGNOSIS — M7918 Myalgia, other site: Secondary | ICD-10-CM

## 2016-06-27 DIAGNOSIS — R51 Headache: Secondary | ICD-10-CM | POA: Diagnosis not present

## 2016-06-27 DIAGNOSIS — R069 Unspecified abnormalities of breathing: Secondary | ICD-10-CM | POA: Diagnosis not present

## 2016-06-27 DIAGNOSIS — Y999 Unspecified external cause status: Secondary | ICD-10-CM | POA: Insufficient documentation

## 2016-06-27 DIAGNOSIS — R0781 Pleurodynia: Secondary | ICD-10-CM | POA: Diagnosis not present

## 2016-06-27 DIAGNOSIS — T7411XA Adult physical abuse, confirmed, initial encounter: Secondary | ICD-10-CM | POA: Diagnosis not present

## 2016-06-27 DIAGNOSIS — M542 Cervicalgia: Secondary | ICD-10-CM | POA: Diagnosis not present

## 2016-06-27 HISTORY — DX: Unspecified convulsions: R56.9

## 2016-06-27 HISTORY — DX: Palpitations: R00.2

## 2016-06-27 MED ORDER — MORPHINE SULFATE (PF) 4 MG/ML IV SOLN
4.0000 mg | Freq: Once | INTRAVENOUS | Status: AC
  Administered 2016-06-27: 4 mg via INTRAVENOUS
  Filled 2016-06-27: qty 1

## 2016-06-27 NOTE — ED Notes (Signed)
Patient transported to CT 

## 2016-06-27 NOTE — ED Notes (Signed)
Patient's sister calledLowella Bandy- [phone]

## 2016-06-27 NOTE — ED Notes (Signed)
No answer in lobby for vital sign recheck 
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
## 2016-06-27 NOTE — ED Notes (Signed)
This RN found pt in hallway walking toward exit. Pt states he wants to leave because he has waited too long. RN strongly suggested to pt to wait for the results of his CT and XRay. RN notified PA of pt situation. PA also strongly suggested pt stay. Pt walked back to room then went through another door to leave. PA notified.
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
## 2016-06-28 ENCOUNTER — Emergency Department (HOSPITAL_COMMUNITY)
Admission: EM | Admit: 2016-06-28 | Discharge: 2016-06-28 | Disposition: A | Discharge status: Home / Self Care | Payer: Medicare Other | Attending: Dermatology | Admitting: Dermatology

## 2016-06-28 NOTE — ED Notes (Signed)
No answer for room placement.

## 2018-05-03 ENCOUNTER — Emergency Department (HOSPITAL_COMMUNITY)
Admission: EM | Admit: 2018-05-03 | Discharge: 2018-05-03 | Disposition: A | Discharge status: Home / Self Care | Payer: Medicare Other | Attending: Emergency Medicine | Admitting: Emergency Medicine

## 2018-05-03 ENCOUNTER — Encounter (HOSPITAL_COMMUNITY): Payer: Self-pay

## 2018-05-03 ENCOUNTER — Other Ambulatory Visit: Payer: Self-pay

## 2018-05-03 DIAGNOSIS — Z5321 Procedure and treatment not carried out due to patient leaving prior to being seen by health care provider: Secondary | ICD-10-CM | POA: Diagnosis not present

## 2018-05-03 DIAGNOSIS — R42 Dizziness and giddiness: Secondary | ICD-10-CM | POA: Insufficient documentation

## 2018-05-03 DIAGNOSIS — R0602 Shortness of breath: Secondary | ICD-10-CM | POA: Insufficient documentation

## 2018-05-03 LAB — BASIC METABOLIC PANEL
Anion gap: 10 (ref 5–15)
BUN: 6 mg/dL (ref 6–20)
CO2: 27 mmol/L (ref 22–32)
CREATININE: 0.88 mg/dL (ref 0.61–1.24)
Calcium: 9.4 mg/dL (ref 8.9–10.3)
Chloride: 101 mmol/L (ref 101–111)
GFR calc non Af Amer: >60 mL/min (ref >60)
Glucose, Bld: 107 mg/dL — ABNORMAL HIGH (ref 65–99)
POTASSIUM: 3.8 mmol/L (ref 3.5–5.1)
Sodium: 138 mmol/L (ref 135–145)

## 2018-05-03 LAB — CBC
HEMATOCRIT: 44.5 % (ref 39.0–52.0)
Hemoglobin: 14.9 g/dL (ref 13.0–17.0)
MCH: 31 pg (ref 26.0–34.0)
MCHC: 33.5 g/dL (ref 30.0–36.0)
MCV: 92.7 fL (ref 78.0–100.0)
PLATELETS: 345 10*3/uL (ref 150–400)
RBC: 4.8 MIL/uL (ref 4.22–5.81)
RDW: 12.6 % (ref 11.5–15.5)
WBC: 11.3 10*3/uL — ABNORMAL HIGH (ref 4.0–10.5)

## 2018-05-03 LAB — I-STAT TROPONIN, ED: Troponin i, poc: 0 ng/mL (ref 0.00–0.08)
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
## 2020-05-11 ENCOUNTER — Emergency Department (HOSPITAL_COMMUNITY)
Admission: EM | Admit: 2020-05-11 | Discharge: 2020-05-11 | Disposition: A | Discharge status: Home / Self Care | Payer: Medicare Other | Attending: Emergency Medicine | Admitting: Emergency Medicine

## 2020-05-11 ENCOUNTER — Encounter (HOSPITAL_COMMUNITY): Payer: Self-pay | Admitting: Emergency Medicine

## 2020-05-11 DIAGNOSIS — F1123 Opioid dependence with withdrawal: Secondary | ICD-10-CM | POA: Diagnosis not present

## 2020-05-11 DIAGNOSIS — R251 Tremor, unspecified: Secondary | ICD-10-CM | POA: Diagnosis not present

## 2020-05-11 DIAGNOSIS — F17210 Nicotine dependence, cigarettes, uncomplicated: Secondary | ICD-10-CM | POA: Insufficient documentation

## 2020-05-11 DIAGNOSIS — F1193 Opioid use, unspecified with withdrawal: Secondary | ICD-10-CM

## 2020-05-11 LAB — ETHANOL: Alcohol, Ethyl (B): <10 mg/dL (ref <10)

## 2020-05-11 LAB — CBC WITH DIFFERENTIAL/PLATELET
Abs Immature Granulocytes: 0.03 10*3/uL (ref 0.00–0.07)
Basophils Absolute: 0 10*3/uL (ref 0.0–0.1)
Basophils Relative: 0 %
Eosinophils Absolute: 0 10*3/uL (ref 0.0–0.5)
Eosinophils Relative: 0 %
HCT: 45.3 % (ref 39.0–52.0)
Hemoglobin: 15.6 g/dL (ref 13.0–17.0)
Immature Granulocytes: 0 %
Lymphocytes Relative: 13 %
Lymphs Abs: 0.9 10*3/uL (ref 0.7–4.0)
MCH: 32.8 pg (ref 26.0–34.0)
MCHC: 34.4 g/dL (ref 30.0–36.0)
MCV: 95.2 fL (ref 80.0–100.0)
Monocytes Absolute: 0.2 10*3/uL (ref 0.1–1.0)
Monocytes Relative: 2 %
Neutro Abs: 6.1 10*3/uL (ref 1.7–7.7)
Neutrophils Relative %: 85 %
Platelets: 288 10*3/uL (ref 150–400)
RBC: 4.76 MIL/uL (ref 4.22–5.81)
RDW: 11.9 % (ref 11.5–15.5)
WBC: 7.2 10*3/uL (ref 4.0–10.5)
nRBC: 0 % (ref 0.0–0.2)

## 2020-05-11 LAB — RAPID URINE DRUG SCREEN, HOSP PERFORMED
Amphetamines: POSITIVE — AB
Barbiturates: NOT DETECTED
Benzodiazepines: POSITIVE — AB
Cocaine: NOT DETECTED
Opiates: POSITIVE — AB
Tetrahydrocannabinol: NOT DETECTED

## 2020-05-11 LAB — COMPREHENSIVE METABOLIC PANEL
ALT: 34 U/L (ref 0–44)
AST: 29 U/L (ref 15–41)
Albumin: 4.1 g/dL (ref 3.5–5.0)
Alkaline Phosphatase: 54 U/L (ref 38–126)
Anion gap: 11 (ref 5–15)
BUN: 7 mg/dL (ref 6–20)
CO2: 24 mmol/L (ref 22–32)
Calcium: 8.9 mg/dL (ref 8.9–10.3)
Chloride: 106 mmol/L (ref 98–111)
Creatinine, Ser: 0.69 mg/dL (ref 0.61–1.24)
GFR calc Af Amer: >60 mL/min (ref >60)
GFR calc non Af Amer: >60 mL/min (ref >60)
Glucose, Bld: 119 mg/dL — ABNORMAL HIGH (ref 70–99)
Potassium: 3.7 mmol/L (ref 3.5–5.1)
Sodium: 141 mmol/L (ref 135–145)
Total Bilirubin: 1.1 mg/dL (ref 0.3–1.2)
Total Protein: 7.7 g/dL (ref 6.5–8.1)

## 2020-05-11 LAB — SALICYLATE LEVEL: Salicylate Lvl: <7 mg/dL — ABNORMAL LOW (ref 7.0–30.0)

## 2020-05-11 LAB — CBG MONITORING, ED: Glucose-Capillary: 111 mg/dL — ABNORMAL HIGH (ref 70–99)

## 2020-05-11 LAB — ACETAMINOPHEN LEVEL: Acetaminophen (Tylenol), Serum: <10 ug/mL — ABNORMAL LOW (ref 10–30)

## 2020-05-11 MED ORDER — LORAZEPAM 2 MG/ML IJ SOLN
1.0000 mg | Freq: Once | INTRAMUSCULAR | Status: AC
  Administered 2020-05-11: 1 mg via INTRAVENOUS
  Filled 2020-05-11: qty 1

## 2020-05-11 NOTE — ED Triage Notes (Signed)
Patient here form home reporting withdrawal from Suboxone. Reports that he was unable to get it and did heroin yesterday and meth today.

## 2020-05-11 NOTE — ED Notes (Addendum)
Patient ambulated to bathroom. Once he came out, patient stated he took a suboxone while in bathroom. Patient stated he had the suboxone on him. Patient requested IV be removed.

## 2020-05-11 NOTE — ED Notes (Signed)
Provider notified about suboxone.
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
## 2020-05-11 NOTE — ED Provider Notes (Addendum)
[facility]-EMERGENCY DEPT Provider Note   CSN: [id number] Arrival date & time: 05/11/20  1143     History Chief Complaint  Patient presents with  . Withdrawal    Greg Velasquez is a 29 y.o. male history of opioid abuse.  Patient presents today for concern of opioid withdrawal.  He reports that he comes from [place] and he normally takes Suboxone regularly but has been unable to get established with a clinic here in [place] since he moved 2 weeks ago.  Patient reports that today he took 1 pill of "naloxone" around 2 hours ago.  He reports shortly afterwards he developed "symptoms of withdrawal".  Patient reports that he is having chills, nausea, anxiety and tremors these have been persistent for the last 2 hours.  He denies fall/injury, fever, chest pain/shortness of breath, vomiting, SI/HI, hallucinations or any additional concerns.  HPI     Past Medical History:  Diagnosis Date  . Chronic back pain greater than 3 months duration   . Heart palpitations     Patient Active Problem List   Diagnosis Date Noted  . Cellulitis of left lower extremity 06/01/2014  . Poison ivy dermatitis 06/01/2014  . Cellulitis 06/01/2014  . OBESITY, NOS 02/09/2007  . ATTENTION DEFICIT, W/HYPERACTIVITY 02/09/2007    Past Surgical History:  Procedure Laterality Date  . WRIST FRACTURE SURGERY         No family history on file.  Social History   Tobacco Use  . Smoking status: Current Every Day Smoker    Packs/day: 2.00    Types: Cigarettes  . Smokeless tobacco: Never Used  Substance Use Topics  . Alcohol use: Yes  . Drug use: Yes    Types: Cocaine, Methamphetamines, IV    Comment: Heroine, Benzo    Home Medications Prior to Admission medications   Medication Sig Start Date End Date Taking? Authorizing Provider  acetaminophen (TYLENOL) 500 MG tablet Take 1,000 mg by mouth every 6 (six) hours as needed for mild pain.    [provider]  permethrin  (ELIMITE) 5 % cream Apply to affected area once; repeat in 1 week as needed 03/21/16   Muthersbaugh, Dahlia Client, PA-C    Allergies    Vicodin [hydrocodone-acetaminophen]  Review of Systems   Review of Systems Ten systems are reviewed and are negative for acute change except as noted in the HPI  Physical Exam Updated Vital Signs BP (!) 142/88 (BP Location: Left Arm)   Pulse 85   Temp 97.6 F (36.4 C) (Oral)   Resp 20   SpO2 100%   Physical Exam Constitutional:      General: He is not in acute distress.    Appearance: Normal appearance. He is well-developed. He is not ill-appearing or diaphoretic.  HENT:     Head: Normocephalic and atraumatic.  Eyes:     General: Vision grossly intact. Gaze aligned appropriately.     Pupils: Pupils are equal, round, and reactive to light.  Neck:     Trachea: Trachea and phonation normal.  Pulmonary:     Effort: Pulmonary effort is normal. No respiratory distress.  Abdominal:     General: There is no distension.     Palpations: Abdomen is soft.     Tenderness: There is no abdominal tenderness. There is no guarding or rebound.  Musculoskeletal:        General: Normal range of motion.     Cervical back: Normal range of motion.  Skin:  General: Skin is warm and dry.  Neurological:     Mental Status: He is alert.     GCS: GCS eye subscore is 4. GCS verbal subscore is 5. GCS motor subscore is 6.     Comments: Speech is clear and goal oriented, follows commands Major Cranial nerves without deficit, no facial droop Moves extremities without ataxia, coordination intact  Psychiatric:        Behavior: Behavior normal.     ED Results / Procedures / Treatments   Labs (all labs ordered are listed, but only abnormal results are displayed) Labs Reviewed - No data to display  EKG None  Radiology No results found.  Procedures Procedures (including critical care time)  Medications Ordered in ED Medications - No data to display  ED Course    I have reviewed the triage vital signs and the nursing notes.  Pertinent labs & imaging results that were available during my care of the patient were reviewed by me and considered in my medical decision making (see chart for details).    MDM Rules/Calculators/A&P                      Additional History Obtained: 1. Nursing notes from this visit.  Patient reported to triage that he was unable to get Suboxone and took some heroin and meth yesterday and today.  He denied this during my evaluation. 2. No ED visits within the last 2 years.  On evaluation 29 year old male anxious appears to be in opioid withdrawal.  He reports to me that he took naloxone 2 hours ago accidentally which may have resulted in this event.  He has no pain anywhere just reports he is feeling agitated and tremulous.  He denies any SI/HI, hallucinations ingestions or additional concerns.  Patient was given 1 mg Ativan to help with agitation, will reassess. - Patient was reassessed multiple times, resting comfortably in bed no acute distress.  Allowed to rest. - Reassessed patient around 3:30 PM he is slightly lethargic than earlier this could have been from the 1 mg Ativan.  He is arousable to voice reports that he is feeling well but does not feel that he can leave if.  I reviewed nursing notes it appears the patient was took a Suboxone around 2:00?  He found this in his pocket apparently.  I was not previously notified that this is happened.  It appears that patient may be too lethargic to safely be discharged at this time, medical clearance labs have been ordered, patient will need to metabolize, be reassessed prior to discharge.  His airway is intact, no indication for Narcan at this time.  Care handoff given to [place] PA-C at shift change, plan of care is to follow-up on medical clearance labs, reevaluate.  Final disposition per oncoming team.   Note: Portions of this report may have been transcribed using  voice recognition software. Every effort was made to ensure accuracy; however, inadvertent computerized transcription errors may still be present. Final Clinical Impression(s) / ED Diagnoses Final diagnoses:  None    Rx / DC Orders ED Discharge Orders    None           Weiland, Tomich 05/11/20 1539    Charlesetta Shanks, MD 05/21/20 1414

## 2020-05-11 NOTE — ED Notes (Signed)
Patient a/o, ambulated to bathroom. Writer provided patient with crackers and water per patient request

## 2020-05-31 ENCOUNTER — Emergency Department (HOSPITAL_COMMUNITY)
Admission: EM | Admit: 2020-05-31 | Discharge: 2020-05-31 | Payer: Medicare Other | Attending: Emergency Medicine | Admitting: Emergency Medicine

## 2020-05-31 ENCOUNTER — Encounter (HOSPITAL_COMMUNITY): Payer: Self-pay

## 2020-05-31 ENCOUNTER — Other Ambulatory Visit: Payer: Self-pay

## 2020-05-31 ENCOUNTER — Emergency Department (HOSPITAL_COMMUNITY): Payer: Medicare Other

## 2020-05-31 DIAGNOSIS — F17210 Nicotine dependence, cigarettes, uncomplicated: Secondary | ICD-10-CM | POA: Diagnosis not present

## 2020-05-31 DIAGNOSIS — F1910 Other psychoactive substance abuse, uncomplicated: Secondary | ICD-10-CM

## 2020-05-31 DIAGNOSIS — F1490 Cocaine use, unspecified, uncomplicated: Secondary | ICD-10-CM | POA: Insufficient documentation

## 2020-05-31 DIAGNOSIS — Y9389 Activity, other specified: Secondary | ICD-10-CM | POA: Diagnosis not present

## 2020-05-31 DIAGNOSIS — T401X3A Poisoning by heroin, assault, initial encounter: Secondary | ICD-10-CM | POA: Diagnosis present

## 2020-05-31 DIAGNOSIS — S50311A Abrasion of right elbow, initial encounter: Secondary | ICD-10-CM | POA: Insufficient documentation

## 2020-05-31 DIAGNOSIS — T07XXXA Unspecified multiple injuries, initial encounter: Secondary | ICD-10-CM

## 2020-05-31 DIAGNOSIS — X58XXXA Exposure to other specified factors, initial encounter: Secondary | ICD-10-CM | POA: Diagnosis not present

## 2020-05-31 DIAGNOSIS — R0781 Pleurodynia: Secondary | ICD-10-CM | POA: Insufficient documentation

## 2020-05-31 DIAGNOSIS — Y929 Unspecified place or not applicable: Secondary | ICD-10-CM | POA: Insufficient documentation

## 2020-05-31 DIAGNOSIS — Y999 Unspecified external cause status: Secondary | ICD-10-CM | POA: Diagnosis not present

## 2020-05-31 DIAGNOSIS — S80211A Abrasion, right knee, initial encounter: Secondary | ICD-10-CM | POA: Diagnosis not present

## 2020-05-31 DIAGNOSIS — F1110 Opioid abuse, uncomplicated: Secondary | ICD-10-CM

## 2020-05-31 DIAGNOSIS — S20211A Contusion of right front wall of thorax, initial encounter: Secondary | ICD-10-CM

## 2020-05-31 LAB — CBC
HCT: 44.5 % (ref 39.0–52.0)
Hemoglobin: 14.8 g/dL (ref 13.0–17.0)
MCH: 32.4 pg (ref 26.0–34.0)
MCHC: 33.3 g/dL (ref 30.0–36.0)
MCV: 97.4 fL (ref 80.0–100.0)
Platelets: 309 10*3/uL (ref 150–400)
RBC: 4.57 MIL/uL (ref 4.22–5.81)
RDW: 12.4 % (ref 11.5–15.5)
WBC: 7.3 10*3/uL (ref 4.0–10.5)
nRBC: 0 % (ref 0.0–0.2)

## 2020-05-31 LAB — COMPREHENSIVE METABOLIC PANEL
ALT: 29 U/L (ref 0–44)
AST: 30 U/L (ref 15–41)
Albumin: 4.2 g/dL (ref 3.5–5.0)
Alkaline Phosphatase: 48 U/L (ref 38–126)
Anion gap: 10 (ref 5–15)
BUN: 9 mg/dL (ref 6–20)
CO2: 27 mmol/L (ref 22–32)
Calcium: 8.9 mg/dL (ref 8.9–10.3)
Chloride: 103 mmol/L (ref 98–111)
Creatinine, Ser: 0.92 mg/dL (ref 0.61–1.24)
GFR calc Af Amer: >60 mL/min (ref >60)
GFR calc non Af Amer: >60 mL/min (ref >60)
Glucose, Bld: 90 mg/dL (ref 70–99)
Potassium: 4 mmol/L (ref 3.5–5.1)
Sodium: 140 mmol/L (ref 135–145)
Total Bilirubin: 0.8 mg/dL (ref 0.3–1.2)
Total Protein: 7.5 g/dL (ref 6.5–8.1)

## 2020-05-31 LAB — ETHANOL: Alcohol, Ethyl (B): <10 mg/dL (ref <10)

## 2020-05-31 MED ORDER — TETANUS-DIPHTH-ACELL PERTUSSIS 5-2.5-18.5 LF-MCG/0.5 IM SUSP
0.5000 mL | Freq: Once | INTRAMUSCULAR | Status: AC
  Administered 2020-05-31: 0.5 mL via INTRAMUSCULAR
  Filled 2020-05-31: qty 0.5

## 2020-05-31 MED ORDER — NALOXONE HCL 4 MG/0.1ML NA LIQD
1.0000 | Freq: Once | NASAL | Status: DC
  Filled 2020-05-31: qty 4

## 2020-05-31 MED ORDER — NALOXONE HCL 4 MG/0.1ML NA LIQD
1.0000 | Freq: Once | NASAL | Status: AC
  Administered 2020-05-31: 1 via NASAL

## 2020-05-31 NOTE — ED Notes (Signed)
He is with his sitter; and [employer] officers x 2 maintain their vigil. He tells [place] he is hungry.

## 2020-05-31 NOTE — ED Triage Notes (Signed)
He tells me that "I was running through the woods last night and I jumped a fence, landed hard and hurt my (right) rib". He then tells me "I was running from the cops and they tackled me". He also tells me "I swallowed a bag of heroin at about 3 O'clock this morning". He is drowsy and in no distress. As I write this he has handcuffs on and two [employer] officers, who arrived with pt. Maintain their vigil.

## 2020-05-31 NOTE — ED Provider Notes (Signed)
[facility]-EMERGENCY DEPT Provider Note   CSN: [id number] Arrival date & time: 05/31/20  1307     History No chief complaint on file.   Greg Velasquez is a 29 y.o. male.  Patient brought by EMS and [employer], with reported ingestion of heroin 1-2 hours ago. Denies other ingestion. Denies swallowing baggie or anything else containing additional meds or drugs.  Pt with hx substance abuse, denies trying to harm self. Denies depression or SI. Pt is very uncooperative, yelling that he wants water and handcuffs removed - level 5 caveat.   The history is provided by the patient, the EMS personnel and the police. The history is limited by the condition of the patient.       Past Medical History:  Diagnosis Date  . Chronic back pain greater than 3 months duration   . Heart palpitations     Patient Active Problem List   Diagnosis Date Noted  . Cellulitis of left lower extremity 06/01/2014  . Poison ivy dermatitis 06/01/2014  . Cellulitis 06/01/2014  . OBESITY, NOS 02/09/2007  . ATTENTION DEFICIT, W/HYPERACTIVITY 02/09/2007    Past Surgical History:  Procedure Laterality Date  . WRIST FRACTURE SURGERY         No family history on file.  Social History   Tobacco Use  . Smoking status: Current Every Day Smoker    Packs/day: 2.00    Types: Cigarettes  . Smokeless tobacco: Never Used  Substance Use Topics  . Alcohol use: Yes  . Drug use: Yes    Types: Cocaine, Methamphetamines, IV    Comment: Heroine, Benzo    Home Medications Prior to Admission medications   Medication Sig Start Date End Date Taking? Authorizing Provider  acetaminophen (TYLENOL) 500 MG tablet Take 1,000 mg by mouth every 6 (six) hours as needed for mild pain.    [provider]  permethrin (ELIMITE) 5 % cream Apply to affected area once; repeat in 1 week as needed 03/21/16   Muthersbaugh, Dahlia Client, PA-C    Allergies    Vicodin [hydrocodone-acetaminophen]  Review of Systems     Review of Systems  Unable to perform ROS: Other  level 5 caveat, patient uncooperative w history/ros, ?due to substance abuse and mental health issues.     Physical Exam Updated Vital Signs BP 121/63 (BP Location: Left Arm)   Pulse (!) 113   Temp 98.2 F (36.8 C) (Oral)   Resp 20   SpO2 97%   Physical Exam Vitals and nursing note reviewed.  Constitutional:      Appearance: Normal appearance. He is well-developed.  HENT:     Head: Atraumatic.     Nose: Nose normal.     Mouth/Throat:     Mouth: Mucous membranes are moist.     Pharynx: Oropharynx is clear.  Eyes:     General: No scleral icterus.    Conjunctiva/sclera: Conjunctivae normal.     Pupils: Pupils are equal, round, and reactive to light.  Neck:     Trachea: No tracheal deviation.  Cardiovascular:     Rate and Rhythm: Normal rate and regular rhythm.     Pulses: Normal pulses.     Heart sounds: Normal heart sounds. No murmur heard.  No friction rub. No gallop.   Pulmonary:     Effort: Pulmonary effort is normal. No accessory muscle usage or respiratory distress.     Breath sounds: Normal breath sounds.     Comments: Right rib/chest wall tenderness. Normal  chest wall movement. No crepitus.  Abdominal:     General: Bowel sounds are normal. There is no distension.     Palpations: Abdomen is soft.     Tenderness: There is no abdominal tenderness. There is no guarding.     Comments: No abd wall bruising or contusion.   Genitourinary:    Comments: No cva tenderness. Musculoskeletal:        General: No swelling or tenderness.     Cervical back: Normal range of motion and neck supple. No rigidity.     Comments: Superficial abrasion to right elbow and knee. CTLS spine, non tender, aligned, no step off. Good rom bil ext without pain or focal bony tenderness.   Skin:    General: Skin is warm and dry.     Findings: No rash.  Neurological:     Mental Status: He is alert.     Comments: Alert, speech clear. Moves bil  extremities purposefully with good strength.   Psychiatric:     Comments: Yelling, agitated, uncooperative.      ED Results / Procedures / Treatments   Labs (all labs ordered are listed, but only abnormal results are displayed) Results for orders placed or performed during the hospital encounter of 05/31/20  CBC  Result Value Ref Range   WBC 7.3 4.0 - 10.5 K/uL   RBC 4.57 4.22 - 5.81 MIL/uL   Hemoglobin 14.8 13.0 - 17.0 g/dL   HCT 44.5 39 - 52 %   MCV 97.4 80.0 - 100.0 fL   MCH 32.4 26.0 - 34.0 pg   MCHC 33.3 30.0 - 36.0 g/dL   RDW 12.4 11.5 - 15.5 %   Platelets 309 150 - 400 K/uL   nRBC 0.0 0.0 - 0.2 %  Comprehensive metabolic panel  Result Value Ref Range   Sodium 140 135 - 145 mmol/L   Potassium 4.0 3.5 - 5.1 mmol/L   Chloride 103 98 - 111 mmol/L   CO2 27 22 - 32 mmol/L   Glucose, Bld 90 70 - 99 mg/dL   BUN 9 6 - 20 mg/dL   Creatinine, Ser 0.92 0.61 - 1.24 mg/dL   Calcium 8.9 8.9 - 10.3 mg/dL   Total Protein 7.5 6.5 - 8.1 g/dL   Albumin 4.2 3.5 - 5.0 g/dL   AST 30 15 - 41 U/L   ALT 29 0 - 44 U/L   Alkaline Phosphatase 48 38 - 126 U/L   Total Bilirubin 0.8 0.3 - 1.2 mg/dL   GFR calc non Af Amer >60 >60 mL/min   GFR calc Af Amer >60 >60 mL/min   Anion gap 10 5 - 15  Ethanol  Result Value Ref Range   Alcohol, Ethyl (B) <10 <10 mg/dL     EKG None  Radiology DG Ribs Unilateral W/Chest Right  Result Date: 05/31/2020 CLINICAL DATA:  29 year old male with fall and right chest wall pain. EXAM: RIGHT RIBS AND CHEST - 3+ VIEW COMPARISON:  Chest radiograph dated 06/27/2016. FINDINGS: No fracture or other bone lesions are seen involving the ribs. There is no evidence of pneumothorax or pleural effusion. Both lungs are clear. Heart size and mediastinal contours are within normal limits. IMPRESSION: Negative. Electronically Signed   By: Anner Crete M.D.   On: 05/31/2020 16:01    Procedures Procedures (including critical care time)  Medications Ordered in  ED Medications - No data to display  ED Course  I have reviewed the triage vital signs and the nursing notes.  Pertinent labs & imaging results that were available during my care of the patient were reviewed by me and considered in my medical decision making (see chart for details).    MDM Rules/Calculators/A&P                          Patient provided po fluids. Continuous pulse ox and monitor. Imaging ordered.   Reviewed nursing notes and prior charts for additional history.   Labs sent.   Patient unsure of last tetanus imm. Tetanus im.   Acetaminophen po. Po fluids. Food.   On multiple rechecks, no resp depression. Pulse ox 99%.   Labs reviewed/interpreted by me - wbc/hgb normal. Chem normal.   Xrays reviewed/interpreted by me - no fx, no ptx.   Recheck pt comfortable. No distress.   Pt with no episodes resp depression, sob, during several hour period of ED observation.   After 4+ hours in ED, fully awake and alert, has eaten/drank, no resp depression, no drowsiness.   Appears stable for d/c in custody of law enforcement.        Final Clinical Impression(s) / ED Diagnoses Final diagnoses:  None    Rx / DC Orders ED Discharge Orders    None       Cathren Laine, MD 05/31/20 1718

## 2020-05-31 NOTE — Discharge Instructions (Addendum)
It was our pleasure to provide your ER care today - we hope that you feel better.  Avoid drug use - see resource guide provided for rehab/treatment facilities.  Never take any medication other than as prescribed, and never ingest large quantities of drugs.   In event of opiate overdose, use narcan kit as indicated.   Return to ER right away if worse, new symptoms, excessive drowsiness, problems breathing, or other concern.

## 2020-07-04 ENCOUNTER — Encounter (HOSPITAL_COMMUNITY): Payer: Self-pay

## 2020-09-09 ENCOUNTER — Other Ambulatory Visit: Payer: Self-pay

## 2020-09-09 ENCOUNTER — Encounter (HOSPITAL_COMMUNITY): Payer: Self-pay | Admitting: Emergency Medicine

## 2020-09-09 ENCOUNTER — Emergency Department (HOSPITAL_COMMUNITY)
Admission: EM | Admit: 2020-09-09 | Discharge: 2020-09-09 | Attending: Emergency Medicine | Admitting: Emergency Medicine

## 2020-09-09 DIAGNOSIS — F17210 Nicotine dependence, cigarettes, uncomplicated: Secondary | ICD-10-CM | POA: Diagnosis not present

## 2020-09-09 DIAGNOSIS — T510X1A Toxic effect of ethanol, accidental (unintentional), initial encounter: Secondary | ICD-10-CM | POA: Diagnosis present

## 2020-09-09 DIAGNOSIS — T490X2A Poisoning by local antifungal, anti-infective and anti-inflammatory drugs, intentional self-harm, initial encounter: Secondary | ICD-10-CM

## 2020-09-09 DIAGNOSIS — R1110 Vomiting, unspecified: Secondary | ICD-10-CM | POA: Insufficient documentation

## 2020-09-09 DIAGNOSIS — R45851 Suicidal ideations: Secondary | ICD-10-CM | POA: Insufficient documentation

## 2020-09-09 LAB — BASIC METABOLIC PANEL
Anion gap: 11 (ref 5–15)
BUN: 8 mg/dL (ref 6–20)
CO2: 27 mmol/L (ref 22–32)
Calcium: 9 mg/dL (ref 8.9–10.3)
Chloride: 102 mmol/L (ref 98–111)
Creatinine, Ser: 0.8 mg/dL (ref 0.61–1.24)
GFR calc Af Amer: >60 mL/min (ref >60)
GFR calc non Af Amer: >60 mL/min (ref >60)
Glucose, Bld: 93 mg/dL (ref 70–99)
Potassium: 3.7 mmol/L (ref 3.5–5.1)
Sodium: 140 mmol/L (ref 135–145)

## 2020-09-09 NOTE — ED Provider Notes (Addendum)
[facility]-EMERGENCY DEPT Provider Note   CSN: [id number] Arrival date & time: 09/09/20  0417     History Chief Complaint  Patient presents with  . DRUNK HAND SANITIZER    Greg Velasquez is a 29 y.o. male.  Patient to ED with [facility] after reporting that he drank 1000 ml hand sanitizer yesterday afternoon. He states that he wanted to kill himself. He vomited x 1 after ingestion. No hematemesis. Currently, he reports "I feel bad", but denies nausea, abdominal pain, SOB. He is requesting food and drink, "I'm starving."  The history is provided by the patient. No language interpreter was used.       Past Medical History:  Diagnosis Date  . Chronic back pain greater than 3 months duration   . Heart palpitations   . Seizures [facility])     Patient Active Problem List   Diagnosis Date Noted  . Cellulitis of left lower extremity 06/01/2014  . Poison ivy dermatitis 06/01/2014  . Cellulitis 06/01/2014  . OBESITY, NOS 02/09/2007  . ATTENTION DEFICIT, W/HYPERACTIVITY 02/09/2007    Past Surgical History:  Procedure Laterality Date  . FRACTURE SURGERY     Right Arm  . WRIST FRACTURE SURGERY         History reviewed. No pertinent family history.  Social History   Tobacco Use  . Smoking status: Current Every Day Smoker    Packs/day: 2.00    Types: Cigarettes  . Smokeless tobacco: Never Used  Substance Use Topics  . Alcohol use: Yes  . Drug use: Yes    Types: Cocaine, Methamphetamines, IV    Comment: Heroine, Benzo    Home Medications Prior to Admission medications   Medication Sig Start Date End Date Taking? Authorizing Provider  acetaminophen (TYLENOL) 500 MG tablet Take 1,000 mg by mouth every 6 (six) hours as needed for mild pain.    [provider]  permethrin (ELIMITE) 5 % cream Apply to affected area once; repeat in 1 week as needed 03/21/16   Muthersbaugh, Dahlia Client, PA-C    Allergies    Vicodin  [hydrocodone-acetaminophen]  Review of Systems   Review of Systems  Constitutional: Negative for chills, diaphoresis and fever.  HENT: Negative.   Eyes: Negative for visual disturbance.  Respiratory: Negative.  Negative for cough and shortness of breath.   Cardiovascular: Negative.  Negative for chest pain.  Gastrointestinal: Positive for vomiting. Negative for abdominal pain and nausea.  Genitourinary: Negative.  Negative for dysuria and hematuria.  Musculoskeletal: Negative.   Skin: Negative.   Neurological: Negative.   Psychiatric/Behavioral: Positive for suicidal ideas.    Physical Exam Updated Vital Signs BP 137/84   Pulse (!) 101   Temp 97.7 F (36.5 C) (Oral)   Resp 18   Ht 6' (1.829 m)   Wt 90.7 kg   SpO2 97%   BMI 27.12 kg/m   Physical Exam Vitals and nursing note reviewed.  Constitutional:      General: He is not in acute distress.    Appearance: Normal appearance. He is well-developed. He is not ill-appearing, toxic-appearing or diaphoretic.  HENT:     Head: Normocephalic.  Eyes:     Pupils: Pupils are equal, round, and reactive to light.  Cardiovascular:     Rate and Rhythm: Normal rate and regular rhythm.     Heart sounds: No murmur heard.   Pulmonary:     Effort: Pulmonary effort is normal.     Breath sounds: Normal breath  sounds. No wheezing, rhonchi or rales.  Abdominal:     General: Bowel sounds are normal.     Palpations: Abdomen is soft.     Tenderness: There is no abdominal tenderness. There is no guarding or rebound.  Musculoskeletal:        General: Normal range of motion.     Cervical back: Normal range of motion and neck supple.  Skin:    General: Skin is warm and dry.     Findings: No rash.  Neurological:     Mental Status: He is alert and oriented to person, place, and time.     ED Results / Procedures / Treatments   Labs (all labs ordered are listed, but only abnormal results are displayed) Labs Reviewed  BASIC METABOLIC  PANEL  URINALYSIS, ROUTINE W REFLEX MICROSCOPIC    EKG None  Radiology No results found.  Procedures Procedures (including critical care time)  Medications Ordered in ED Medications - No data to display  ED Course  I have reviewed the triage vital signs and the nursing notes.  Pertinent labs & imaging results that were available during my care of the patient were reviewed by me and considered in my medical decision making (see chart for details).    MDM Rules/Calculators/A&P                          Patient to ED 14 hours after ingesting 1000 ml hand sanitizer while incarcerated.   He is very well appearing. No abnormal vital signs. He is not vomiting. He is not intoxicated or altered, and is oriented. No electrolyte abnormalities.   The patient states clearly that he ingested the hand sanitizer in an effort to cause self harm. He is currently under suicide watch at the jail. TTS consultation deferred for this reason.   He is felt stable for discharge home. No further intervention needed.   Final Clinical Impression(s) / ED Diagnoses Final diagnoses:  None   1. Hand sanitizer ingestion 2. Suicidal ideation  Rx / DC Orders ED Discharge Orders    None       Elpidio Anis, PA-C 09/09/20 0702    Elpidio Anis, PA-C 09/09/20 2202    Wilkie Aye Mayer Masker, MD 09/09/20 [phone]

## 2020-09-09 NOTE — Discharge Instructions (Addendum)
Your labs and vital signs have been normal in the ED. Your ingestion of hand sanitizer is not felt to be life threatening and you are considered stable to return to the jail in custody.

## 2020-09-09 NOTE — ED Triage Notes (Addendum)
Patient drunk hand sanitizer and jail nurse sent patient over to get epi and fluids. Patient vitals are stable. Patient vitals do not warrant epi or fluids. Once patient cleared he is going back to jail
